# Patient Record
Sex: Male | Born: 1955
Health system: Southern US, Community
[De-identification: ages and names within clinical notes are randomized; demographics above are authoritative.]

## PROBLEM LIST (undated history)

## (undated) DIAGNOSIS — I10 Essential (primary) hypertension: Principal | ICD-10-CM

## (undated) DIAGNOSIS — M1990 Unspecified osteoarthritis, unspecified site: Secondary | ICD-10-CM

## (undated) DIAGNOSIS — L409 Psoriasis, unspecified: Secondary | ICD-10-CM

## (undated) DIAGNOSIS — F419 Anxiety disorder, unspecified: Secondary | ICD-10-CM

## (undated) DIAGNOSIS — E785 Hyperlipidemia, unspecified: Secondary | ICD-10-CM

## (undated) HISTORY — DX: Anxiety disorder, unspecified: F41.9

## (undated) HISTORY — DX: Essential (primary) hypertension: I10

## (undated) HISTORY — DX: Hyperlipidemia, unspecified: E78.5

## (undated) HISTORY — DX: Psoriasis, unspecified: L40.9

## (undated) HISTORY — PX: SHOULDER SURGERY: SHX246

## (undated) HISTORY — PX: APPENDECTOMY: SHX54

## (undated) HISTORY — DX: Unspecified osteoarthritis, unspecified site: M19.90

## (undated) HISTORY — PX: OTHER SURGICAL HISTORY: SHX169

## (undated) HISTORY — PX: POLYPECTOMY: SHX149

## (undated) HISTORY — PX: LEG SURGERY: SHX1003

---

## 2003-01-06 ENCOUNTER — Ambulatory Visit (HOSPITAL_BASED_OUTPATIENT_CLINIC_OR_DEPARTMENT_OTHER): Admission: RE | Admit: 2003-01-06 | Discharge: 2003-01-07 | Payer: Self-pay | Admitting: Orthopedic Surgery

## 2003-11-29 ENCOUNTER — Encounter (INDEPENDENT_AMBULATORY_CARE_PROVIDER_SITE_OTHER): Payer: Self-pay | Admitting: Specialist

## 2003-11-29 ENCOUNTER — Inpatient Hospital Stay (HOSPITAL_COMMUNITY): Admission: EM | Admit: 2003-11-29 | Discharge: 2003-11-30 | Payer: Self-pay | Admitting: Emergency Medicine

## 2005-08-20 ENCOUNTER — Ambulatory Visit (HOSPITAL_COMMUNITY): Admission: RE | Admit: 2005-08-20 | Discharge: 2005-08-20 | Payer: Self-pay | Admitting: Orthopedic Surgery

## 2005-08-20 ENCOUNTER — Ambulatory Visit (HOSPITAL_BASED_OUTPATIENT_CLINIC_OR_DEPARTMENT_OTHER): Admission: RE | Admit: 2005-08-20 | Discharge: 2005-08-20 | Payer: Self-pay | Admitting: Orthopedic Surgery

## 2007-01-17 ENCOUNTER — Ambulatory Visit: Payer: Self-pay | Admitting: Gastroenterology

## 2007-01-31 ENCOUNTER — Encounter: Payer: Self-pay | Admitting: Gastroenterology

## 2007-01-31 ENCOUNTER — Ambulatory Visit: Payer: Self-pay | Admitting: Gastroenterology

## 2008-01-14 LAB — PSA: .: 0.5

## 2009-03-15 LAB — HEMOGLOBIN A1C: Hgb A1c MFr Bld: 6 % (ref 4.0–6.0)

## 2009-06-04 ENCOUNTER — Ambulatory Visit: Payer: Self-pay | Admitting: Family Medicine

## 2009-11-29 ENCOUNTER — Ambulatory Visit: Payer: Self-pay | Admitting: Family Medicine

## 2009-11-29 DIAGNOSIS — E785 Hyperlipidemia, unspecified: Secondary | ICD-10-CM | POA: Insufficient documentation

## 2009-11-29 DIAGNOSIS — I10 Essential (primary) hypertension: Secondary | ICD-10-CM

## 2009-11-29 DIAGNOSIS — E1159 Type 2 diabetes mellitus with other circulatory complications: Secondary | ICD-10-CM | POA: Insufficient documentation

## 2009-11-29 HISTORY — DX: Hyperlipidemia, unspecified: E78.5

## 2009-11-29 HISTORY — DX: Essential (primary) hypertension: I10

## 2010-02-08 ENCOUNTER — Encounter (INDEPENDENT_AMBULATORY_CARE_PROVIDER_SITE_OTHER): Payer: Self-pay | Admitting: *Deleted

## 2010-06-28 ENCOUNTER — Encounter (INDEPENDENT_AMBULATORY_CARE_PROVIDER_SITE_OTHER): Payer: Self-pay | Admitting: *Deleted

## 2010-07-31 ENCOUNTER — Encounter (INDEPENDENT_AMBULATORY_CARE_PROVIDER_SITE_OTHER): Payer: Self-pay | Admitting: *Deleted

## 2010-08-02 ENCOUNTER — Ambulatory Visit: Payer: Self-pay | Admitting: Gastroenterology

## 2010-08-16 ENCOUNTER — Ambulatory Visit: Payer: Self-pay | Admitting: Gastroenterology

## 2010-08-16 HISTORY — PX: COLONOSCOPY: SHX174

## 2010-08-18 ENCOUNTER — Encounter: Payer: Self-pay | Admitting: Gastroenterology

## 2011-01-02 NOTE — Letter (Signed)
Summary: Results Letter  [facility]  [address] [place], [place] [address]   Phone: [phone]  Fax: [phone]        August 18, 2010 MRN: [id number]    Nathaniel Montes [address] [place], [place]  [address]    Dear [name],   Good news.  The polyps that were removed during your recent procedure were NOT pre-cancerous.  However since you have had pre-cancerous polyps removed in the past, I recommend that you have a repeat colonoscopy in 5 years.  We will therefore put your information in our reminder system and will contact you in 5 years to schedule a repeat procedure.  Please call if you have any questions or concerns.      Sincerely,  Rachael Fee MD  This letter has been electronically signed by your physician.  Appended Document: Results Letter letter mailed

## 2011-01-02 NOTE — Miscellaneous (Signed)
Summary: [facility] PV  Clinical Lists Changes  Medications: Added new medication of MOVIPREP 100 GM  SOLR (PEG-KCL-NACL-NASULF-NA ASC-C) As per prep instructions. - Signed Rx of MOVIPREP 100 GM  SOLR (PEG-KCL-NACL-NASULF-NA ASC-C) As per prep instructions.;  #1 x 0;  Signed;  Entered by: Ezra Sites RN;  Authorized by: Rachael Fee MD;  Method used: Electronically to [facility] [phone]*, [address] [place], [place]  [address], Ph: [phone], Fax: [phone] Observations: Added new observation of ALLERGY REV: Done (08/02/2010 7:58)    Prescriptions: MOVIPREP 100 GM  SOLR (PEG-KCL-NACL-NASULF-NA ASC-C) As per prep instructions.  #1 x 0   Entered by:   Ezra Sites RN   Authorized by:   Rachael Fee MD   Signed by:   Ezra Sites RN on 08/02/2010   Method used:   Electronically to        [facility] [phone]* (retail)       [address] [place], [place]  [address]       Ph: [phone]       Fax: [phone]   RxID:   [id number]

## 2011-01-02 NOTE — Letter (Signed)
Summary: [facility] Instructions  [facility]  [address] [place], [place] [address]   Phone: [phone]  Fax: [phone]       Nathaniel Montes    09/30/1969    MRN: [id number]        Procedure Day Dorna Bloom: Wed. 08/16/2010     Arrival Time: 10:00AM     Procedure Time: 11:00AM     Location of Procedure:                    Juliann Pares  [facility] (4th Floor)                        PREPARATION FOR COLONOSCOPY WITH MOVIPREP   Starting 5 days prior to your procedure 09/09 do not eat nuts, seeds, popcorn, corn, beans, peas,  salads, or any raw vegetables.  Do not take any fiber supplements (e.g. Metamucil, Citrucel, and Benefiber).  THE DAY BEFORE YOUR PROCEDURE         DATE: 09/13  DAY: [place] Mote.  1.  Drink clear liquids the entire day-NO SOLID FOOD  2.  Do not drink anything colored red or purple.  Avoid juices with pulp.  No orange juice.  3.  Drink at least 64 oz. (8 glasses) of fluid/clear liquids during the day to prevent dehydration and help the prep work efficiently.  CLEAR LIQUIDS INCLUDE: Water Jello Ice Popsicles Tea (sugar ok, no milk/cream) Powdered fruit flavored drinks Coffee (sugar ok, no milk/cream) Gatorade Juice: apple, white grape, white cranberry  Lemonade Clear bullion, consomm, broth Carbonated beverages (any kind) Strained chicken noodle soup Hard Candy                             4.  In the morning, mix first dose of MoviPrep solution:    Empty 1 Pouch A and 1 Pouch B into the disposable container    Add lukewarm drinking water to the top line of the container. Mix to dissolve    Refrigerate (mixed solution should be used within 24 hrs)  5.  Begin drinking the prep at 5:00 p.m. The MoviPrep container is divided by 4 marks.   Every 15 minutes drink the solution down to the next mark (approximately 8 oz) until the full liter is complete.   6.  Follow completed prep with 16 oz of clear liquid of your choice (Nothing red or  purple).  Continue to drink clear liquids until bedtime.  7.  Before going to bed, mix second dose of MoviPrep solution:    Empty 1 Pouch A and 1 Pouch B into the disposable container    Add lukewarm drinking water to the top line of the container. Mix to dissolve    Refrigerate  THE DAY OF YOUR PROCEDURE      DATE: 09/14 DAY: Wed.  Beginning at 6:00AM (5 hours before procedure):         1. Every 15 minutes, drink the solution down to the next mark (approx 8 oz) until the full liter is complete.  2. Follow completed prep with 16 oz. of clear liquid of your choice.    3. You may drink clear liquids until 9:00AM (2 HOURS BEFORE PROCEDURE).   MEDICATION INSTRUCTIONS  Unless otherwise instructed, you should take regular prescription medications with a small sip of water   as early as possible the morning of your procedure.  OTHER INSTRUCTIONS  You will need a responsible adult at least 57 years of age to accompany you and drive you home.   This person must remain in the waiting room during your procedure.  Wear loose fitting clothing that is easily removed.  Leave jewelry and other valuables at home.  However, you may wish to bring a book to read or  an iPod/MP3 player to listen to music as you wait for your procedure to start.  Remove all body piercing jewelry and leave at home.  Total time from sign-in until discharge is approximately 2-3 hours.  You should go home directly after your procedure and rest.  You can resume normal activities the  day after your procedure.  The day of your procedure you should not:   Drive   Make legal decisions   Operate machinery   Drink alcohol   Return to work  You will receive specific instructions about eating, activities and medications before you leave.    The above instructions have been reviewed and explained to me by   Ezra Sites RN  August 02, 2010 8:17 AM     I fully understand and can verbalize  these instructions _____________________________ Date _________

## 2011-01-02 NOTE — Procedures (Signed)
Summary: Colonoscopy  Patient: Nathaniel Montes Note: All result statuses are Final unless otherwise noted.  Tests: (1) Colonoscopy (COL)   COL Colonoscopy           DONE     [facility]     [address]     [place], [place]  [address]           COLONOSCOPY PROCEDURE REPORT           PATIENT:  Nathaniel Montes, Nathaniel Montes  MR#:  [id number]     BIRTHDATE:  July 15, 1956, 53 yrs. old  GENDER:  male     ENDOSCOPIST:  Rachael Fee, MD     PROCEDURE DATE:  08/16/2010     PROCEDURE:  Colonoscopy with snare polypectomy     ASA CLASS:  Class II     INDICATIONS:  6 polyps removed in 2008, 4 were adenomatous     MEDICATIONS:   Fentanyl 75 mcg IV, Versed 8 mg IV           DESCRIPTION OF PROCEDURE:   After the risks benefits and     alternatives of the procedure were thoroughly explained, informed     consent was obtained.  Digital rectal exam was performed and     revealed no rectal masses.   The LB CF-H180AL [id number] endoscope     was introduced through the anus and advanced to the cecum, which     was identified by both the appendix and ileocecal valve, without     limitations.  The quality of the prep was good, using MoviPrep.     The instrument was then slowly withdrawn as the colon was fully     examined.     <<PROCEDUREIMAGES>>     FINDINGS:  There were five small, sessile polyps found. All were     removed with cold snare. Four were retrieved and sent to pathology     (jar 1). These ranged in size from 2mm to 4mm, located in     descending and sigmoid segments (see image4).  This was otherwise     a normal examination of the colon (see image5, image3, and     image1).   Retroflexed views in the rectum revealed no     abnormalities.    The scope was then withdrawn from the patient     and the procedure completed.     COMPLICATIONS:  None           ENDOSCOPIC IMPRESSION:     1) Five small polyps, all removed.  Four were retrieved and sent     to pathology     2) Otherwise normal  examination           RECOMMENDATIONS:     1) Given your personal history of adenomatous (pre-cancerous)     polyps, you will need a repeat colonoscopy in 5 years even if the     polyps removed today are NOT precancerous.     2) You will receive a letter within 1-2 weeks with the results     of your biopsy as well as final recommendations. Please call my     office if you have not received a letter after 3 weeks.  Likely to     need repeat colonoscopy in 3-5 years.           ______________________________     Rachael Fee, MD           n.  eSIGNED:   Rachael Fee at 08/16/2010 11:32 AM           Lynford Humphrey, [id number]  Note: An exclamation mark (!) indicates a result that was not dispersed into the flowsheet. Document Creation Date: 08/16/2010 11:33 AM _______________________________________________________________________  (1) Order result status: Final Collection or observation date-time: 08/16/2010 11:26 Requested date-time:  Receipt date-time:  Reported date-time:  Referring Physician:   Ordering Physician: Rob Bunting [phone]) Specimen Source:  Source: Launa Grill Order Number: [id number] Lab site:   Appended Document: Colonoscopy     Procedures Next Due Date:    Colonoscopy: 08/2015

## 2011-01-02 NOTE — Letter (Signed)
Summary: Previsit letter  [facility]  [address] [place], [place] [address]   Phone: [phone]  Fax: [phone]       06/28/2010 MRN: [id number]  Nathaniel Montes [address] [place], [place]  [address]  Dear [name],  Welcome to the Gastroenterology Division at [facility].    You are scheduled to see a nurse for your pre-procedure visit on 08-02-10 at 8:00a.m. on the 3rd floor at [facility], [address]  We ask that you try to arrive at our office 15 minutes prior to your appointment time to allow for check-in.  Your nurse visit will consist of discussing your medical and surgical history, your immediate family medical history, and your medications.    Please bring a complete list of all your medications or, if you prefer, bring the medication bottles and we will list them.  We will need to be aware of both prescribed and over the counter drugs.  We will need to know exact dosage information as well.  If you are on blood thinners (Coumadin, Plavix, Aggrenox, Ticlid, etc.) please call our office today/prior to your appointment, as we need to consult with your physician about holding your medication.   Please be prepared to read and sign documents such as consent forms, a financial agreement, and acknowledgement forms.  If necessary, and with your consent, a friend or relative is welcome to sit-in on the nurse visit with you.  Please bring your insurance card so that we may make a copy of it.  If your insurance requires a referral to see a specialist, please bring your referral form from your primary care physician.  No co-pay is required for this nurse visit.     If you cannot keep your appointment, please call [phone] to cancel or reschedule prior to your appointment date.  This allows [place] the opportunity to schedule an appointment for another patient in need of care.    Thank you for choosing [facility] for your medical  needs.  We appreciate the opportunity to care for you.  Please visit [place] at our website  to learn more about our practice.                     Sincerely.                                                                                                                   The Gastroenterology Division

## 2011-01-02 NOTE — Letter (Signed)
Summary: Colonoscopy Letter  [facility]  [address] [place], [place] [address]   Phone: [phone]  Fax: [phone]      February 08, 2010 MRN: [id number]   JEREMEY BASCOM [address] [place], [place]  [address]   Dear Mr. Laser,   According to your medical record, it is time for you to schedule a Colonoscopy. The American Cancer Society recommends this procedure as a method to detect early colon cancer. Patients with a family history of colon cancer, or a personal history of colon polyps or inflammatory bowel disease are at increased risk.  This letter has been generated based on the recommendations made at the time of your procedure. If you feel that in your particular situation this may no longer apply, please contact our office.  Please call our office at [phone] to schedule this appointment or to update your records at your earliest convenience.  Thank you for cooperating with [place] to provide you with the very best care possible.   Sincerely,  Rachael Fee, M.D.  [facility] Gastroenterology Division [phone]

## 2011-04-20 NOTE — Op Note (Signed)
NAME:  Nathaniel Montes, Nathaniel Montes                         ACCOUNT NO.:  [id number]   MEDICAL RECORD NO.:  [id number]                   PATIENT TYPE:  AMB   LOCATION:  DSC                                  FACILITY:  [facility]   PHYSICIAN:  Harvie Junior, M.D.                DATE OF BIRTH:  [birth year]   DATE OF PROCEDURE:  01/06/2003  DATE OF DISCHARGE:  01/07/2003                                 OPERATIVE REPORT   PREOPERATIVE DIAGNOSIS:  Displaced left fibula fracture with widening of the  median mortis.   POSTOPERATIVE DIAGNOSIS:  Displaced left fibula fracture with widening of  the median mortis.   OPERATION PERFORMED:  Open reduction internal fixation of left distal fibula  fracture.   SURGEON:  Harvie Junior, M.D.   ASSISTANT:  Marshia Ly, P.A.   ANESTHESIA:  General.   INDICATIONS FOR PROCEDURE:  The patient is a 55 year old male with along  history of having a fall and twist of his ankle.  He ultimately was  evaluated and noted to have a displaced ankle fracture.  After discussions  of treatment options, ultimately was taken to the operating room for open  reduction internal fixation.   DESCRIPTION OF PROCEDURE:  The patient was taken to the operating room where  after adequate anesthesia was obtained with a general anesthetic, the  patient was placed supine on the operating table.  The left leg was then  prepped and draped in the usual sterile fashion.  Following Esmarch  exsanguination of the extremity, a blood pressure tourniquet was inflated to  300 mmHg.  Following this, a linear incision was made over the lateral  aspect of the ankle.  Subcutaneous tissue dissected down to the level of the  fibula.  The fibula was identified, clotting  elements were removed.  The  fracture was anatomically reduced and held with an interfragmentary screw.  A five hole one third tubular plate was then attached to the lateral side of  the ankle in standard AO fashion.  Fluoroscopic imaging  was used throughout  the case to ensure that the screw lengths were appropriate as well as the  mortise closed down with reduction of the lateral side of the ankle.  At  this point the wound was copiously irrigated and suctioned dry.  The  subcutaneous layer was closed with 3-0Vicryl and the skin with skin staples.  Sterile compressive dressing was applied as well as a U posterior splint and  the patient taken to the recovery room where he was noted to be in  satisfactory condition.  The estimated blood loss for this procedure was  none.  Harvie Junior, M.D.    Ranae Plumber  D:  03/25/2003  T:  03/25/2003  Job:  [id number]

## 2011-04-20 NOTE — H&P (Signed)
NAME:  Nathaniel Montes, Nathaniel Montes                         ACCOUNT NO.:  [id number]   MEDICAL RECORD NO.:  [id number]                   PATIENT TYPE:  EMS   LOCATION:  ED                                   [facility]ACILITY:  [facility]   PHYSICIAN:  Ollen Gross. Vernell Morgans, M.D.              DATE OF BIRTH:  August 02, 1956   DATE OF ADMISSION:  11/29/2003  DATE OF DISCHARGE:                                HISTORY & PHYSICAL   Nathaniel Montes is a 55 year old white male who woke up this morning with some  right lower quadrant pain.  His pain has worsened throughout the day. He  denies any nausea, vomiting, fevers, chills, chest pain, shortness of  breath, diarrhea, or dysuria. He came to the emergency department because  the pain was continuing to get worse. He had a CBC done which was  significant for a white count of 11,100 and a UA which was negative. He has  otherwise been in pretty good health.  The rest of his review of systems is  unremarkable.   PAST MEDICAL HISTORY:  Significant for hypercholesterolemia.   PAST SURGICAL HISTORY:  Significant for left leg ORIF, left knee surgery.   MEDICATIONS:  Paxil and Zocor.   ALLERGIES:  No known drug allergies.   SOCIAL HISTORY:  He denies any use of tobacco products. He occasionally  drinks some alcohol.   FAMILY HISTORY:  Noncontributory.   PHYSICAL EXAMINATION:  VITAL SIGNS: His temperature is 100.6, blood pressure  166/94, pulse 106.  GENERAL: He is a well-developed, well-nourished, white male in no acute  distress.  SKIN: Warm and dry with no jaundice.  HEENT:  Eyes - extraocular muscles are intact. Pupils equal, round, and  reactive to light. Sclerae nonicteric.  LUNGS: Clear bilaterally with no use of accessory respiratory muscles.  HEART: Regular rate and rhythm with an impulse on the left chest.  ABDOMEN: Soft with some focal guarding and tenderness in the right lower  quadrant, but no evidence of peritonitis. I cannot palpate any masses or   hepatosplenomegaly.  EXTREMITIES: No clubbing, cyanosis, or edema.  PSYCHOLOGIC: She is awake, alert, and oriented x3, with no anxiety or  depression.   His lab work was again significant for a UA that was negative and a white  count of 11,100.   ASSESSMENT/PLAN:  This is a 55 year old gentleman with what appears to be  acute appendicitis on physical exam. I recommend he consider going to the  operating room for laparoscopic appendectomy. I have explained to him and  his family the risks and benefits of the operation as well as some of the  technical aspects. He understands and wishes to proceed.  Ollen Gross. Vernell Morgans, M.D.   PST/MEDQ  D:  11/29/2003  T:  11/29/2003  Job:  [id number]

## 2011-04-20 NOTE — Op Note (Signed)
NAME:  Nathaniel Montes, Nathaniel Montes                         ACCOUNT NO.:  [id number]   MEDICAL RECORD NO.:  [id number]                   PATIENT TYPE:  AMB   LOCATION:  DSC                                  FACILITY:  [facility]   PHYSICIAN:  Harvie Junior, M.D.                DATE OF BIRTH:  [birth year]   DATE OF PROCEDURE:  01/06/2003  DATE OF DISCHARGE:                                 OPERATIVE REPORT   PREOPERATIVE DIAGNOSIS:  Fracture of lateral ankle, left side with mortise  displacement.   POSTOPERATIVE DIAGNOSIS:  Fracture of lateral ankle, left side with mortise  displacement.   OPERATION PERFORMED:  Open reduction internal fixation of left lateral  malleolus fracture with open removal of the deltoid ligament from the medial  aspect of the ankle with irrigation and debridement of the ankle joint.   SURGEON:  Harvie Junior, M.D.   ASSISTANT:  Marshia Ly, P.A.   ANESTHESIA:  General.   INDICATIONS FOR PROCEDURE:  The patient is a 55 year old male with a long  history of having a twisting type injury to his ankle.  He was evaluated and  noted to have a fracture of the ankle with mortise widening.  Because of  displacement of the ankle mortise, and obvious pain and swelling, patient  was evaluated and noted to have this fracture.  We talked about treatment  options and felt that he would be best served with open reduction internal  fixation of his ankle and discussed this with him.  He was set up for this  surgery and brought to the operating room for this procedure.   DESCRIPTION OF PROCEDURE:  The patient was brought to the operating room and  after an adequate level of anesthesia was obtained with general anesthetic,  the patient was placed on the operating table.  The left leg was prepped and  draped in the usual sterile fashion.  Following this, an incision was made  over the lateral aspect of the ankle.  The subcutaneous tissue were  dissected down to the level of the fibula.   Multiple attempts were made at  cleansing fracture elements and trying to reduce the fibula.  It just would  not reduce anatomically.  At this point attention was turned towards the  medial aspect of the ankle where a small incision was made.  A medial  arthrotomy was undertaken.  The ankle was irrigated.  A Freer elevator was  used to sweep the deltoid ligament out of the medial aspect of the joint.  Once this was accomplished, the lateral ankle fracture would now reduce  anatomically.  At this point interfragmentary fixation was achieved with a  20 mm cortical interference screw.  Following this, attention was turned  towards a five-hole plate.  The five-hole plate was used to fix the lateral  side of the ankle.  Excellent fixation was achieved with standard AO  technique in a five-hole plate.  Cancellous screws distally, cortical screws  proximally.  At this point x-ray imaging was used.  The ankle mortise had  reduced nicely and the lateral x-ray showed all the screws to be properly  positioned.  Fluoroscopy was used throughout this case.  At this point the  wound was copiously irrigated and suctioned dry.  The medial wound was  closed with 4-0 nylon interrupted sutures.  The lateral wound was closed  with a combination of 2-0 Vicryl and 4-0 nylon suture.  At this point a  sterile compressive dressing was applied on the ankle and the patient was  taken to the recovery room where she was noted  to be in satisfactory condition.  The patient was placed in a plaster  splint.  The estimated blood loss for this procedure was none.                                                Harvie Junior, M.D.    Ranae Plumber  D:  01/06/2003  T:  01/06/2003  Job:  [id number]

## 2011-04-20 NOTE — Op Note (Signed)
NAMEBENJIMAN, SEDGWICK               ACCOUNT NO.:  [id number]   MEDICAL RECORD NO.:  [id number]          PATIENT TYPE:  AMB   LOCATION:  DSC                          FACILITY:  [facility]   PHYSICIAN:  Feliberto Gottron. Turner Daniels, M.D.   DATE OF BIRTH:  December 12, 1955   DATE OF PROCEDURE:  08/20/2005  DATE OF DISCHARGE:                                 OPERATIVE REPORT   PREOPERATIVE DIAGNOSIS:  Right shoulder impingement syndrome, partial-  thickness rotator cuff tear, labral tear and biceps tear.   POSTOPERATIVE DIAGNOSIS:  Right shoulder impingement syndrome, partial-  thickness rotator cuff tear, labral tear and biceps tear.   PROCEDURE:  Right shoulder arthroscopic anteroinferior acromioplasty,  debridement of degenerative complex labral tear, debridement of biceps  tendon stump -- the distal aspect had actually retracted into the arm -- and  debridement of internal leaflet rotator cuff tear, partial-thickness.   SURGEON:  Feliberto Gottron. Turner Daniels, M.D.   FIRST ASSISTANT:  Skip Mayer, P.A.-C.   ANESTHETIC:  Interscalene block with general anesthesia.   ESTIMATED BLOOD LOSS:  Minimal.   FLUID REPLACEMENT:  800 mL of crystalloid.   DRAINS PLACED:  None.   TOURNIQUET TIME:  None.   INDICATIONS FOR PROCEDURE:  [age]-year-old man injured on the job with  persistent right shoulder pain and catching sensation.  MRI scan showed  impingement, rotator cuff irritation, partial-thickness tear, labral tear  and possible biceps tear.  Having failed conservative treatment, he is taken  for arthroscopic evaluation and treatment of his right shoulder.  Risks and  benefits of surgery were discussed with him in advance and all questions  answered.   DESCRIPTION OF PROCEDURE:  The patient was identified by armband and taken  the operating room at [facility].  Appropriate site monitors  were attached and interscalene block on the right anesthesia induced.  This  was followed a general endotracheal  anesthesia and he was then placed in the  beach-chair position and the right upper extremity prepped and draped in the  usual sterile fashion from the wrist to the hemithorax.  Using a #11 blade,  standard portals were then made 1.5 cm anterior to the [facility] joint, lateral to  the junction of the middle and posterior thirds of the acromion and  posterior to the posterolateral corner of the acromion process.  The inflow  was placed anteriorly, the arthroscope laterally and a 4.2 great white  sucker shaver posteriorly.  Diagnostic arthroscopy revealed a fairly large  type 2 subacromial spur, which was outlined with the great white sucker  shaver and then removed with a 4.5 hooded Vortex bur.  This allowed further  evaluation of the rotator cuff, which was grossly intact externally.  Small  bleeders were identified and cauterized with the underwater electrocautery  and the arthroscope was then inserted into the glenohumeral joint, where we  immediately identified some fairly large complex degenerative flap tears of  the labrum.  Superiorly, there was a stump of the biceps tendon; it was  subluxing in and out of the glenohumeral joint and these degenerative torn  tissues were then  removed.  The distal limb of the biceps was retracted into  the arm and it was no where to be found.  There was an internal leaflet tear  of the rotator cuff, partial thickness, maybe 5% of the supraspinatus; this  was debrided back to stable margin.  The rest the rotator cuff and articular  cartilages were in good condition.  The labrum was then probed; there was no  tearing of labrum off the rim of the glenoid; most of it seemed to be  peripheral and degenerative in nature, except for the biceps stump, which  looked like it had been traumatically torn.  Satisfied with the debridement,  the shoulder was then irrigated out with normal saline solution, the  arthroscopic instruments removed and a dressing of Xeroform, 4 x 4  dressing  sponges, Hypafix tape and a sling applied.  The patient was then laid  supine, awakened and taken to the recovery room without difficulty.      Feliberto Gottron. Turner Daniels, M.D.  Electronically Signed     FJR/MEDQ  D:  08/20/2005  T:  08/21/2005  Job:  [id number]

## 2011-04-20 NOTE — Op Note (Signed)
NAME:  Nathaniel Montes, Nathaniel Montes                         ACCOUNT NO.:  [id number]   MEDICAL RECORD NO.:  [id number]                   PATIENT TYPE:  INP   LOCATION:  0483                                 FACILITY:  [facility]   PHYSICIAN:  Ollen Gross. Vernell Morgans, M.D.              DATE OF BIRTH:  11/05/1956   DATE OF PROCEDURE:  12/08/2003  DATE OF DISCHARGE:  11/30/2003                                 OPERATIVE REPORT   PREOPERATIVE DIAGNOSIS:  Appendicitis.   POSTOPERATIVE DIAGNOSIS:  Appendicitis.   PROCEDURE:  Laparoscopic appendectomy.   SURGEON:  Ollen Gross. Carolynne Edouard, M.D.   ANESTHESIA:  General endotracheal.   DESCRIPTION OF PROCEDURE:  After informed consent was obtained, the patient  was brought to the operating room and placed in a supine position on the  operating room, where after adequate induction of general anesthesia the  patient's abdomen was prepped with Betadine and draped in the usual sterile  manner.  The area below the umbilicus was infiltrated with 0.25% Marcaine  and a small incision was made with a 15 blade knife.  This incision was  carried down through the subcutaneous tissue bluntly with a Kelly clamp and  Army-Navy retractors until the linea alba was identified.  The linea alba  was incised with a 15 blade knife and each side was grasped with Kocher  clamps and elevated anteriorly.  The preperitoneal space was then probed  bluntly with a hemostat until the peritoneum was opened and access was  gained to the abdominal cavity.  A 0 Vicryl pursestring stitch is placed in  the fascia surrounding the opening.  A Hasson cannula was then placed  through the opening and anchored in place with the previously-placed Vicryl  pursestring stitch.  The abdomen was then insufflated with carbon dioxide  without difficulty.  The patient was then placed in some Trendelenburg  position and rotated so his right side was slightly up.  A site just above  the umbilicus was chosen for placement of a 5  mm port.  This area was  infiltrated with 0.25% Marcaine and a small stab incision was made with a 15  blade knife and a 5 mm port was placed bluntly through this incision into  the abdominal cavity under direct vision.  In the suprapubic area another  site was chosen for placement of a 12 mm port.  This area was infiltrated  with 0.25% Marcaine, a small incision was made with a 15 blade knife, and a  12 mm port was placed bluntly through this incision into the abdominal  cavity under direct vision.  A blunt grasper was placed through the 12 mm  port and the right lower quadrant was inspected and an enlarged, inflamed  appendix was readily identified.  The appendix was able to be held up in the  air with the blunt grasper.  The mesoappendix was then taken down with the  Harmonic scalpel until the base of the appendix at its junction with the  cecum was clear.  A blunt grasper was then placed through the 5 mm port to  hold the appendix up in the air.  An endoscopic GIA stapler with a blue load  was then placed through the 12 mm port and across the base of the appendix  and clamped.  It was allowed to sit for a minute and then fired, thereby  dividing the base of the appendix at its junction with the cecum between  staple lines.  An endoscopic bag was then placed through the 12 mm port and  the appendix was placed within the bag, and the bag was sealed.  The right  lower quadrant was then inspected, and the staple line was intact and the  area was completely hemostatic.  The abdomen was then irrigated with copious  amounts of saline.  The laparoscope was then moved to the 12 mm port and a  grasper was placed through the Hasson and used to grasp the opening in the  bag.  The bag with the appendix was then removed through the infraumbilical  incision without difficulty.  The fascial defect was then closed with the  previously-placed Vicryl pursestring stitch as well as with another  interrupted  0 Vicryl stitch.  The rest of the ports were removed under  direct vision and were found to be hemostatic.  The gas was allowed to  escape.  The fascia of the suprapubic port was closed with an interrupted 0  Vicryl stitch.  Each of the incisions was irrigated with copious amounts of  saline.  The skin incisions were then all closed with interrupted 4-0  Monocryl subcuticular stitches, Benzoin and Steri-Strips were applied.  The  patient tolerated the procedure well.  At the end of the case all needle,  sponge, and instrument counts were correct.  The patient was then awakened  and taken to the recovery room in stable condition.                                               Ollen Gross. Vernell Morgans, M.D.    PST/MEDQ  D:  12/08/2003  T:  12/08/2003  Job:  [id number]

## 2011-06-19 LAB — HEPATIC FUNCTION PANEL: AST: 27 U/L (ref 14–40)

## 2011-06-19 LAB — BASIC METABOLIC PANEL
Creatinine: 1.1 mg/dL (ref 0.6–1.3)
Glucose: 87 mg/dL

## 2011-06-19 LAB — CBC AND DIFFERENTIAL: WBC: 7.9 10^3/mL

## 2011-06-19 LAB — LIPID PANEL: Cholesterol: 193 mg/dL (ref 0–200)

## 2012-05-10 ENCOUNTER — Emergency Department (INDEPENDENT_AMBULATORY_CARE_PROVIDER_SITE_OTHER)
Admission: EM | Admit: 2012-05-10 | Discharge: 2012-05-10 | Disposition: A | Discharge status: Home / Self Care | Payer: Commercial Indemnity | Source: Home / Self Care

## 2012-05-10 DIAGNOSIS — R21 Rash and other nonspecific skin eruption: Secondary | ICD-10-CM

## 2012-05-10 DIAGNOSIS — B029 Zoster without complications: Secondary | ICD-10-CM

## 2012-05-10 MED ORDER — GABAPENTIN 100 MG PO CAPS: 100.0000 mg | ORAL_CAPSULE | Freq: Three times a day (TID) | ORAL | Status: DC

## 2012-05-10 MED ORDER — VALACYCLOVIR HCL 1 G PO TABS: 1000.0000 mg | ORAL_TABLET | Freq: Three times a day (TID) | ORAL | Status: DC

## 2012-05-10 NOTE — ED Provider Notes (Signed)
History     CSN: [id number]  Arrival date & time 05/10/12  1225   First MD Initiated Contact with Patient 05/10/12 1246      Chief Complaint  Patient presents with  . Herpes Zoster   HPI Comments: HPI  This patient complains of a RASH  Location: R upper back, R arm, R upper chest  Onset: 3 days   Course: progressive rash over affected areas with blistering and redness, mild burning  Self-treated with: nothing   Improvement with treatment: n/a  History  Itching: no  Tenderness: mild  New medications/antibiotics: no  Pet exposure: no  Recent travel or tropical exposure: no  New soaps, shampoos, detergent, clothing: no  Tick/insect exposure: no  Red Flags  Feeling ill: no  Fever: no  Facial/tongue swelling/difficulty breathing: no  Diabetic or immunocompromised: on enbrel for psoriasis      Patient is a 56 y.o. male presenting with rash.  Rash  This is a new problem. Episode onset: 3 days ago  The problem is associated with nothing. There has been no fever. Affected Location: R upper back, R arm, R upper chest  The pain is at a severity of 0/10. The patient is experiencing no pain. Associated symptoms include blisters, itching and weeping. Pertinent negatives include no pain. He has tried nothing for the symptoms. Risk factors: on enbrel for psoriasis.    No past medical history on file.  No past surgical history on file.  No family history on file.  History  Substance Use Topics  . Smoking status: Not on file  . Smokeless tobacco: Not on file  . Alcohol Use: Not on file      Review of Systems  Skin: Positive for itching and rash.  All other systems reviewed and are negative.    Allergies  Review of patient's allergies indicates no known allergies.  Home Medications   Current Outpatient Rx  Name Route Sig Dispense Refill  . ETANERCEPT 50 MG/ML Offerman SOLN Subcutaneous Inject 50 mg into the skin once a week.    Marland Kitchen PAROXETINE HCL 10 MG PO TABS Oral Take 10 mg  by mouth every morning.    Marland Kitchen GABAPENTIN 100 MG PO CAPS Oral Take 1 capsule (100 mg total) by mouth 3 (three) times daily. 60 capsule 0  . VALACYCLOVIR HCL 1 G PO TABS Oral Take 1 tablet (1,000 mg total) by mouth 3 (three) times daily. 21 tablet 0    BP 148/84  Pulse 88  Temp(Src) 98.4 F (36.9 C) (Oral)  Resp 14  Ht 5\' 10"  (1.778 m)  Wt 257 lb 4 oz (116.688 kg)  BMI 36.91 kg/m2  SpO2 96%  Physical Exam  Constitutional: He appears well-developed and well-nourished.  HENT:  Head: Normocephalic and atraumatic.  Eyes: Conjunctivae are normal. Pupils are equal, round, and reactive to light.  Neck: Normal range of motion. Neck supple.  Cardiovascular: Normal rate and regular rhythm.   Pulmonary/Chest: Effort normal and breath sounds normal.  Skin:          + erythema and blistering over affected areas.  No purulent drainage.     ED Course  Procedures (including critical care time)  Labs Reviewed - No data to display No results found.   No diagnosis found.    MDM  Exam most consistent with zoster.  No signs of superinfection in setting of enbrel use.  Will rx valtrex  Neurontin for neuralgia Discussed infectious red flags at length.  Follow up  with PCP in 3-5 days.  Handout given.     The patient and/or caregiver has been counseled thoroughly with regard to treatment plan and/or medications prescribed including dosage, schedule, interactions, rationale for use, and possible side effects and they verbalize understanding. Diagnoses and expected course of recovery discussed and will return if not improved as expected or if the condition worsens. Patient and/or caregiver verbalized understanding.             Floydene Flock, MD 05/10/12 1326

## 2012-05-10 NOTE — ED Provider Notes (Signed)
Pt seen by Dr. Alvester Morin for Shingles.  Marlaine Hind, MD 05/10/12 [phone]

## 2012-05-10 NOTE — ED Notes (Signed)
Pt developed red bumpy slightly painful rash on R arm R chest and R upper back started 3 days ago

## 2012-05-10 NOTE — Discharge Instructions (Signed)

## 2012-11-17 ENCOUNTER — Ambulatory Visit (INDEPENDENT_AMBULATORY_CARE_PROVIDER_SITE_OTHER): Payer: Commercial Indemnity | Admitting: Family Medicine

## 2012-11-17 ENCOUNTER — Encounter: Payer: Self-pay | Admitting: Family Medicine

## 2012-11-17 VITALS — BP 149/85 | HR 77 | Ht 70.0 in | Wt 265.0 lb

## 2012-11-17 DIAGNOSIS — E785 Hyperlipidemia, unspecified: Secondary | ICD-10-CM

## 2012-11-17 DIAGNOSIS — F411 Generalized anxiety disorder: Secondary | ICD-10-CM

## 2012-11-17 DIAGNOSIS — L408 Other psoriasis: Secondary | ICD-10-CM

## 2012-11-17 DIAGNOSIS — L409 Psoriasis, unspecified: Secondary | ICD-10-CM

## 2012-11-17 DIAGNOSIS — F419 Anxiety disorder, unspecified: Secondary | ICD-10-CM

## 2012-11-17 DIAGNOSIS — I10 Essential (primary) hypertension: Secondary | ICD-10-CM

## 2012-11-17 HISTORY — DX: Psoriasis, unspecified: L40.9

## 2012-11-17 HISTORY — DX: Anxiety disorder, unspecified: F41.9

## 2012-11-17 LAB — BASIC METABOLIC PANEL WITH GFR
Calcium: 9.8 mg/dL (ref 8.4–10.5)
Creat: 1.02 mg/dL (ref 0.50–1.35)
GFR, Est African American: >89 mL/min
Glucose, Bld: 113 mg/dL — ABNORMAL HIGH (ref 70–99)
Sodium: 138 mEq/L (ref 135–145)

## 2012-11-17 LAB — LIPID PANEL
HDL: 46 mg/dL (ref >39)
Total CHOL/HDL Ratio: 3.8 Ratio
Triglycerides: 177 mg/dL — ABNORMAL HIGH (ref <150)

## 2012-11-17 MED ORDER — PAROXETINE HCL 20 MG PO TABS: 20.0000 mg | ORAL_TABLET | ORAL | Status: DC

## 2012-11-17 NOTE — Progress Notes (Signed)
CC: Nathaniel Montes is a 56 y.o. male is here for Establish Care   Subjective: HPI:  Patient presents to establish care  Carries a diagnosis of hypertension: Using lisinopril 10 mg daily without any recent missed doses. No outside blood pressures to report. Denies shortness of breath, chest pain, angioedema, irregular heartbeat, peripheral edema, orthopnea. Nor cough.  Carries a diagnosis of hyperlipidemia: Has been taking Zocor for years without right upper quadrant pain, myalgias, nor skin or scleral discoloration. Tries to watch the eats, tries to stick to a low calorie diet. No formal exercise program.  Carries a diagnosis anxiety: Has been taking Paxil for over 3-4 years and is very satisfied with the results. Maybe once a month he will have an anxiety attack, unknown triggers, he can control this within minutes with breathing exercises and talk himself out of the panic and anxiety. He denies sexual side effects from the medication. Denies depression, paranoia, mental disturbance  Carries a diagnosis of psoriasis: Was once on Humira but lost its effectiveness. He's very satisfied with enbrel.  He is a patch of psoriasis on his lower back but no other skin complaints. Denies any joint pain, eye pain, nor GI irregularities   Review Of Systems Outlined In HPI  Past Medical History  Diagnosis Date  . HYPERTENSION 11/29/2009  . HYPERLIPIDEMIA 11/29/2009  . Psoriasis 11/17/2012  . Anxiety 11/17/2012     No family history on file.   History  Substance Use Topics  . Smoking status: Former Smoker    Types: Cigarettes  . Smokeless tobacco: Not on file  . Alcohol Use: Yes     Objective: Filed Vitals:   11/17/12 0834  BP: 149/85  Pulse: 77    General: Alert and Oriented, No Acute Distress HEENT: Pupils equal, round, reactive to light. Conjunctivae clear.  External ears unremarkable, canals clear with intact TMs with appropriate landmarks.  Middle ear appears open without  effusion. Pink inferior turbinates.  Moist mucous membranes, pharynx without inflammation nor lesions.  Neck supple without palpable lymphadenopathy nor abnormal masses. Lungs: Clear to auscultation bilaterally, no wheezing/ronchi/rales.  Comfortable work of breathing. Good air movement. Cardiac: Regular rate and rhythm. Normal S1/S2.  No murmurs, rubs, nor gallops.   Abdomen: Obese soft nontender Extremities: No peripheral edema.  Strong peripheral pulses.  Mental Status: No depression, anxiety, nor agitation. Skin: Warm and dry. Psoriatic plaque on upper buttocks, mild hyperkeratosis on the native his neck  Assessment & Plan: Nathaniel Montes was seen today for establish care.  Diagnoses and associated orders for this visit:  Hypertension - BASIC METABOLIC PANEL WITH GFR  Hyperlipidemia - Lipid panel  Anxiety - PARoxetine (PAXIL) 20 MG tablet; Take 1 tablet (20 mg total) by mouth every morning.  Psoriasis  Hypertension: Uncontrolled, I've asked him to focus on low salt and increasing physical activity, if not improved in 4 week followup we will consider increasing lisinopril. Hyperlipidemia: He believes it's been over a year since his cholesterol was checked, fasting lipid panel today Anxiety: Stable, continue Paxil Psoriasis: Controlled and stable continue Embrel    Return in about 4 weeks (around 12/15/2012).

## 2012-11-18 ENCOUNTER — Encounter: Payer: Self-pay | Admitting: Family Medicine

## 2012-11-19 ENCOUNTER — Encounter: Payer: Self-pay | Admitting: Family Medicine

## 2012-12-18 ENCOUNTER — Ambulatory Visit (INDEPENDENT_AMBULATORY_CARE_PROVIDER_SITE_OTHER): Payer: Commercial Indemnity | Admitting: Family Medicine

## 2012-12-18 ENCOUNTER — Encounter: Payer: Self-pay | Admitting: Family Medicine

## 2012-12-18 VITALS — BP 147/82 | HR 66 | Ht 70.0 in | Wt 265.0 lb

## 2012-12-18 DIAGNOSIS — E785 Hyperlipidemia, unspecified: Secondary | ICD-10-CM

## 2012-12-18 DIAGNOSIS — R7301 Impaired fasting glucose: Secondary | ICD-10-CM

## 2012-12-18 DIAGNOSIS — F411 Generalized anxiety disorder: Secondary | ICD-10-CM

## 2012-12-18 DIAGNOSIS — I10 Essential (primary) hypertension: Secondary | ICD-10-CM

## 2012-12-18 DIAGNOSIS — F419 Anxiety disorder, unspecified: Secondary | ICD-10-CM

## 2012-12-18 LAB — HEMOGLOBIN A1C: Mean Plasma Glucose: 128 mg/dL — ABNORMAL HIGH (ref <117)

## 2012-12-18 MED ORDER — LISINOPRIL 30 MG PO TABS: 30.0000 mg | ORAL_TABLET | Freq: Every day | ORAL | Status: DC

## 2012-12-18 MED ORDER — SIMVASTATIN 40 MG PO TABS: 40.0000 mg | ORAL_TABLET | Freq: Every evening | ORAL | Status: DC

## 2012-12-18 NOTE — Progress Notes (Signed)
CC: Nathaniel Montes is a 57 y.o. male is here for Hypertension   Subjective: HPI:  Patient returns for routine followup in good spirits  Anxiety: Continues on Paxil 20 mg a day, describes occasional 5 minutes of frustration/anxiety that occurs maybe once a month, usually do to job stress. Denies mental disturbance, insomnia, depression, declining relationships at home nor the workplace. Describes his quality of life as great.  Essential hypertension: Has been taking lisinopril 20 mg on a daily basis without missed doses . No outside blood pressures to report. Denies cough, angioedema, shortness of breath, chest pain, irregular heartbeat, orthopnea, nor peripheral edema. Denies motor or sensory disturbances Impaired fasting glucose: At his last visit fasting glucose was above 100. He denies polyuria polyphasia or polydipsia. He tries to watch the eats but admits for room for improvement. No formal exercise program. Hyperlipidemia: At his last visit LDL was below a goal of 130. He continues on Zocor on a daily basis 40 mg with no recent missed doses. Denies right upper quadrant pain, myalgias, skin or scleral discoloration, nor limb claudication   Review Of Systems Outlined In HPI  Past Medical History  Diagnosis Date  . HYPERTENSION 11/29/2009  . HYPERLIPIDEMIA 11/29/2009  . Psoriasis 11/17/2012  . Anxiety 11/17/2012     No family history on file.   History  Substance Use Topics  . Smoking status: Former Smoker    Types: Cigarettes  . Smokeless tobacco: Not on file  . Alcohol Use: Yes     Objective: Filed Vitals:   12/18/12 0913  BP: 147/82  Pulse: 66    General: Alert and Oriented, No Acute Distress HEENT: Pupils equal, round, reactive to light. Conjunctivae clear.  External ears unremarkable, canals clearMoist mucous membranes, pharynx without inflammation nor lesions.  Neck supple without palpable lymphadenopathy nor abnormal masses. Lungs: Clear to auscultation  bilaterally, no wheezing/ronchi/rales.  Comfortable work of breathing. Good air movement. Cardiac: Regular rate and rhythm. Normal S1/S2.  No murmurs, rubs, nor gallops.  No carotid bruits Abdomen: Obese soft and nontender Extremities: No peripheral edema.  Strong peripheral pulses.  Mental Status: No depression, anxiety, nor agitation. Skin: Warm and dry.  Assessment & Plan: Dickie was seen today for hypertension.  Diagnoses and associated orders for this visit:  Anxiety  Hypertension - lisinopril (PRINIVIL,ZESTRIL) 30 MG tablet; Take 1 tablet (30 mg total) by mouth daily.  Impaired fasting glucose needs f/u a1c - HgB A1c  Hyperlipidemia - Discontinue: simvastatin (ZOCOR) 40 MG tablet; Take 1 tablet (40 mg total) by mouth every evening. - simvastatin (ZOCOR) 40 MG tablet; Take 1 tablet (40 mg total) by mouth every evening.    Essential hypertension: Uncontrolled, will increase lisinopril to 30 mg daily, stressed importance of lower salt intake and exercise Anxiety: Controlled, continue Paxil regimen Impaired fasting glucose: We'll further investigate with A1c today Hyperlipidemia: Clinically controlled not due for repeat lipid panel for another 9 months.  Return in about 3 months (around 03/18/2013).

## 2012-12-19 ENCOUNTER — Encounter: Payer: Self-pay | Admitting: Family Medicine

## 2012-12-19 DIAGNOSIS — R7303 Prediabetes: Secondary | ICD-10-CM | POA: Insufficient documentation

## 2012-12-30 ENCOUNTER — Other Ambulatory Visit: Payer: Self-pay | Admitting: *Deleted

## 2012-12-30 DIAGNOSIS — I10 Essential (primary) hypertension: Secondary | ICD-10-CM

## 2012-12-30 MED ORDER — LISINOPRIL 30 MG PO TABS: 30.0000 mg | ORAL_TABLET | Freq: Every day | ORAL | Status: DC

## 2013-01-21 ENCOUNTER — Encounter: Payer: Self-pay | Admitting: Family Medicine

## 2013-01-21 DIAGNOSIS — Z86010 Personal history of colonic polyps: Secondary | ICD-10-CM

## 2013-01-21 DIAGNOSIS — Z860101 Personal history of adenomatous and serrated colon polyps: Secondary | ICD-10-CM | POA: Insufficient documentation

## 2013-01-22 ENCOUNTER — Encounter: Payer: Self-pay | Admitting: *Deleted

## 2013-03-13 ENCOUNTER — Encounter: Payer: Self-pay | Admitting: Family Medicine

## 2013-03-13 ENCOUNTER — Ambulatory Visit (INDEPENDENT_AMBULATORY_CARE_PROVIDER_SITE_OTHER): Payer: Commercial Indemnity | Admitting: Family Medicine

## 2013-03-13 VITALS — BP 137/77 | HR 76 | Ht 70.0 in | Wt 264.0 lb

## 2013-03-13 DIAGNOSIS — R7303 Prediabetes: Secondary | ICD-10-CM

## 2013-03-13 DIAGNOSIS — F419 Anxiety disorder, unspecified: Secondary | ICD-10-CM

## 2013-03-13 DIAGNOSIS — I10 Essential (primary) hypertension: Secondary | ICD-10-CM

## 2013-03-13 DIAGNOSIS — R7309 Other abnormal glucose: Secondary | ICD-10-CM

## 2013-03-13 DIAGNOSIS — F411 Generalized anxiety disorder: Secondary | ICD-10-CM

## 2013-03-13 NOTE — Progress Notes (Signed)
CC: Nathaniel Montes is a 57 y.o. male is here for Hypertension   Subjective: HPI:  Followup hypertension: At last visit we increased lisinopril to 30 mg a day. Since then denies cough, angioedema, shortness of breath, chest pain, motor sensory disturbances, orthopnea, peripheral edema. Reports daily compliance  Followup anxiety: Continues on Paxil daily without any anxiety, depression, nor mental disturbance. He denies any side effects of taking a specifically any sexual side effects. He is quite happy with his mood.  Followup prediabetes: At his last visit A1c 6.1. Try to watch what he eats but admits for room for improvement. No formal exercise program.  Has lost some weight since last visit, 3 years ago A1c 6.0. Denies polyuria polyphasia or polydipsia. Denies poorly healing wounds, extremity discomfort, nor vision loss.   Review Of Systems Outlined In HPI  Past Medical History  Diagnosis Date  . HYPERTENSION 11/29/2009  . HYPERLIPIDEMIA 11/29/2009  . Psoriasis 11/17/2012  . Anxiety 11/17/2012     Family History  Problem Relation Age of Onset  . Hypertension Father      History  Substance Use Topics  . Smoking status: Former Smoker    Types: Cigarettes  . Smokeless tobacco: Not on file  . Alcohol Use: Yes     Objective: Filed Vitals:   03/13/13 0922  BP: 137/77  Pulse: 76    Vital signs reviewed. General: Alert and Oriented, No Acute Distress HEENT: Pupils equal, round, reactive to light. Conjunctivae clear.  External ears unremarkable.  Moist mucous membranes. Lungs: Clear and comfortable work of breathing, speaking in full sentences without accessory muscle use. No wheezing rhonchi nor rails Cardiac: Regular rate and rhythm. No murmurs rubs or gallops Neuro: CN II-XII grossly intact, gait normal. Extremities: No peripheral edema.  Strong peripheral pulses.  Mental Status: No depression, anxiety, nor agitation. Logical though process. Skin: Warm and  dry.  Assessment & Plan: Charlies was seen today for hypertension.  Diagnoses and associated orders for this visit:  Prediabetes  HYPERTENSION  Anxiety  Other Orders - Omega-3 Fatty Acids (FISH OIL) 1200 MG CAPS; Take by mouth.    Prediabetes: Clinically controlled, due for A1c repeat after the 19th of this month, patient will return for lab only visit at his convenience after this date. Hypertension, improved and controlled. Continue lisinopril 30 mg, will need BMP with A1c check in future. Anxiety: Control, continue Paxil  Return in about 3 months (around 06/12/2013).

## 2013-11-16 ENCOUNTER — Other Ambulatory Visit: Payer: Self-pay | Admitting: *Deleted

## 2013-11-16 DIAGNOSIS — F419 Anxiety disorder, unspecified: Secondary | ICD-10-CM

## 2013-11-16 DIAGNOSIS — E785 Hyperlipidemia, unspecified: Secondary | ICD-10-CM

## 2013-11-16 MED ORDER — PAROXETINE HCL 20 MG PO TABS: 20.0000 mg | ORAL_TABLET | ORAL | Status: DC

## 2013-11-16 MED ORDER — SIMVASTATIN 40 MG PO TABS: 40.0000 mg | ORAL_TABLET | Freq: Every evening | ORAL | Status: DC

## 2013-12-18 ENCOUNTER — Telehealth: Payer: Self-pay | Admitting: *Deleted

## 2013-12-18 DIAGNOSIS — I10 Essential (primary) hypertension: Secondary | ICD-10-CM

## 2013-12-18 MED ORDER — LISINOPRIL 30 MG PO TABS: 30.0000 mg | ORAL_TABLET | Freq: Every day | ORAL | Status: DC

## 2013-12-18 NOTE — Telephone Encounter (Signed)
Refill request for lisinopril. Pt has not been seen since 4/14 and was to f/u around 7/14. Sent in 30 day supply and called and left message on vm for pt to f/u with dr. Ileene Rubens sometime in the near future before 30 day supply runs out. Sent meds to [facility] [address] as listed in his chart.( pt does not have an appt on file)

## 2013-12-21 ENCOUNTER — Other Ambulatory Visit: Payer: Self-pay | Admitting: *Deleted

## 2013-12-21 DIAGNOSIS — I10 Essential (primary) hypertension: Secondary | ICD-10-CM

## 2013-12-21 MED ORDER — LISINOPRIL 30 MG PO TABS: 30.0000 mg | ORAL_TABLET | Freq: Every day | ORAL | Status: DC

## 2013-12-28 ENCOUNTER — Ambulatory Visit (INDEPENDENT_AMBULATORY_CARE_PROVIDER_SITE_OTHER): Payer: Commercial Indemnity | Admitting: Family Medicine

## 2013-12-28 ENCOUNTER — Encounter: Payer: Self-pay | Admitting: Family Medicine

## 2013-12-28 VITALS — BP 156/80 | HR 74 | Wt 269.0 lb

## 2013-12-28 DIAGNOSIS — M79609 Pain in unspecified limb: Secondary | ICD-10-CM

## 2013-12-28 DIAGNOSIS — R7303 Prediabetes: Secondary | ICD-10-CM

## 2013-12-28 DIAGNOSIS — F419 Anxiety disorder, unspecified: Secondary | ICD-10-CM

## 2013-12-28 DIAGNOSIS — F411 Generalized anxiety disorder: Secondary | ICD-10-CM

## 2013-12-28 DIAGNOSIS — R7309 Other abnormal glucose: Secondary | ICD-10-CM

## 2013-12-28 DIAGNOSIS — E785 Hyperlipidemia, unspecified: Secondary | ICD-10-CM

## 2013-12-28 DIAGNOSIS — Z79899 Other long term (current) drug therapy: Secondary | ICD-10-CM

## 2013-12-28 DIAGNOSIS — M79643 Pain in unspecified hand: Secondary | ICD-10-CM

## 2013-12-28 DIAGNOSIS — Z5181 Encounter for therapeutic drug level monitoring: Secondary | ICD-10-CM

## 2013-12-28 DIAGNOSIS — I10 Essential (primary) hypertension: Secondary | ICD-10-CM

## 2013-12-28 LAB — LIPID PANEL
Cholesterol: 165 mg/dL (ref 0–200)
HDL: 43 mg/dL (ref >39)
LDL CALC: 94 mg/dL (ref 0–99)
Total CHOL/HDL Ratio: 3.8 Ratio
Triglycerides: 138 mg/dL (ref <150)
VLDL: 28 mg/dL (ref 0–40)

## 2013-12-28 LAB — COMPLETE METABOLIC PANEL WITH GFR
ALBUMIN: 4.3 g/dL (ref 3.5–5.2)
ALT: 24 U/L (ref 0–53)
AST: 20 U/L (ref 0–37)
Alkaline Phosphatase: 65 U/L (ref 39–117)
BILIRUBIN TOTAL: 0.4 mg/dL (ref 0.3–1.2)
BUN: 21 mg/dL (ref 6–23)
CALCIUM: 9 mg/dL (ref 8.4–10.5)
CHLORIDE: 106 meq/L (ref 96–112)
CO2: 24 meq/L (ref 19–32)
Creat: 1.22 mg/dL (ref 0.50–1.35)
GFR, EST AFRICAN AMERICAN: 75 mL/min
GFR, EST NON AFRICAN AMERICAN: 64 mL/min
GLUCOSE: 112 mg/dL — AB (ref 70–99)
POTASSIUM: 4.6 meq/L (ref 3.5–5.3)
SODIUM: 141 meq/L (ref 135–145)
TOTAL PROTEIN: 6.8 g/dL (ref 6.0–8.3)

## 2013-12-28 LAB — HEMOGLOBIN A1C
Hgb A1c MFr Bld: 6.1 % — ABNORMAL HIGH (ref <5.7)
Mean Plasma Glucose: 128 mg/dL — ABNORMAL HIGH (ref <117)

## 2013-12-28 MED ORDER — LISINOPRIL-HYDROCHLOROTHIAZIDE 20-25 MG PO TABS: 1.0000 | ORAL_TABLET | Freq: Every day | ORAL | Status: DC

## 2013-12-28 MED ORDER — MELOXICAM 15 MG PO TABS: 15.0000 mg | ORAL_TABLET | Freq: Every day | ORAL | Status: DC | PRN

## 2013-12-28 MED ORDER — PAROXETINE HCL 20 MG PO TABS: 20.0000 mg | ORAL_TABLET | ORAL | Status: DC

## 2013-12-28 MED ORDER — SIMVASTATIN 40 MG PO TABS: 40.0000 mg | ORAL_TABLET | Freq: Every evening | ORAL | Status: DC

## 2013-12-28 NOTE — Progress Notes (Signed)
CC: Nathaniel Montes is a 58 y.o. male is here for Hypertension   Subjective: HPI:  Follow up hypertension: Continues on lisinopril 30 mg daily. No outside blood pressures to report. Denies and side effects. Denies chest pain shortness of breath orthopnea nor peripheral edema  followup hyperlipidemia: Continues on Zocor daily basis without missed doses denies myalgias, right upper quadrant pain. No formal exercise routine. Tries to watch what he eats but admits to 5 pound weight gain over the holidays. Followup anxiety: Continues on Paxil daily basis no known side effects no thoughts of wanting to harm self or others. States quality of life is fantastic from an anxiety standpoint which is now absent ever since starting on Paxil. Denies mental disturbance or depression Complains of 3 months of daily hand pain localized at the base of both thumbs. Worse at the end of the day. No interventions other than Aleve which has not helped. Denies swelling redness or warmth of either joint. Pain is symmetrical and mild in severity. Described only as pain and nonradiating      Review Of Systems Outlined In HPI  Past Medical History  Diagnosis Date  . HYPERTENSION 11/29/2009  . HYPERLIPIDEMIA 11/29/2009  . Psoriasis 11/17/2012  . Anxiety 11/17/2012     Family History  Problem Relation Age of Onset  . Hypertension Father      History  Substance Use Topics  . Smoking status: Former Smoker    Types: Cigarettes  . Smokeless tobacco: Not on file  . Alcohol Use: Yes     Objective: Filed Vitals:   12/28/13 0857  BP: 156/80  Pulse: 74    General: Alert and Oriented, No Acute Distress HEENT: Pupils equal, round, reactive to light. Conjunctivae clear.   moist mucous membranes pharynx unremarkable  Lungs: Clear to auscultation bilaterally, no wheezing/ronchi/rales.  Comfortable work of breathing. Good air movement. Cardiac: Regular rate and rhythm. Normal S1/S2.  No murmurs, rubs, nor gallops.    Extremities: No peripheral edema.  Strong peripheral pulses.  full range of motion strength of both thumbs there is no overlying redness warmth or tenderness to palpation. No pain in anatomical snuff box, Finkelstein's is negative.  Mental Status: No depression, anxiety, nor agitation. Skin: Warm and dry.  Assessment & Plan: Nathaniel Montes was seen today for hypertension.  Diagnoses and associated orders for this visit:  Prediabetes - Hemoglobin A1c  HYPERTENSION - COMPLETE METABOLIC PANEL WITH GFR  Hyperlipidemia LDL goal < 130 FramRisk 11% Dec 2013 - Lipid panel  Encounter for monitoring statin therapy - Lipid panel - COMPLETE METABOLIC PANEL WITH GFR  Hand pain - meloxicam (MOBIC) 15 MG tablet; Take 1 tablet (15 mg total) by mouth daily as needed for pain.  Anxiety - PARoxetine (PAXIL) 20 MG tablet; Take 1 tablet (20 mg total) by mouth every morning.  Hyperlipidemia - simvastatin (ZOCOR) 40 MG tablet; Take 1 tablet (40 mg total) by mouth every evening.  Other Orders - lisinopril-hydrochlorothiazide (PRINZIDE,ZESTORETIC) 20-25 MG per tablet; Take 1 tablet by mouth daily.    Prediabetes: Due for repeat A1c   essential hypertension: Uncontrolled add in hydrochlorothiazide to his lisinopril Hyperlipidemia: Due for repeat lipid panel with liver function Anxiety: Controlled continue Paxil Hand pain: Start meloxicam if not improving after one to 2 weeks and if interfering with quality of life will refer to Dr. Darene Montes. in sports medicine  Return in about 3 months (around 03/28/2014).

## 2014-07-14 ENCOUNTER — Telehealth: Payer: Self-pay | Admitting: Family Medicine

## 2014-07-14 NOTE — Telephone Encounter (Signed)
Pt is over due for f/u appt. Left message on vm to call office to schedule f/u appt

## 2014-07-14 NOTE — Telephone Encounter (Signed)
Patient walked-in this morning request to get a med refill of Lisinopril 20-25 mg faxed to [facility] on [address] Thanks

## 2014-07-22 ENCOUNTER — Ambulatory Visit (INDEPENDENT_AMBULATORY_CARE_PROVIDER_SITE_OTHER): Payer: Commercial Indemnity | Admitting: Family Medicine

## 2014-07-22 ENCOUNTER — Encounter: Payer: Self-pay | Admitting: Family Medicine

## 2014-07-22 VITALS — BP 152/91 | HR 73 | Wt 261.0 lb

## 2014-07-22 DIAGNOSIS — F419 Anxiety disorder, unspecified: Secondary | ICD-10-CM

## 2014-07-22 DIAGNOSIS — M19042 Primary osteoarthritis, left hand: Secondary | ICD-10-CM

## 2014-07-22 DIAGNOSIS — M19049 Primary osteoarthritis, unspecified hand: Secondary | ICD-10-CM

## 2014-07-22 DIAGNOSIS — R7303 Prediabetes: Secondary | ICD-10-CM

## 2014-07-22 DIAGNOSIS — E785 Hyperlipidemia, unspecified: Secondary | ICD-10-CM

## 2014-07-22 DIAGNOSIS — I10 Essential (primary) hypertension: Secondary | ICD-10-CM

## 2014-07-22 DIAGNOSIS — M19041 Primary osteoarthritis, right hand: Secondary | ICD-10-CM | POA: Insufficient documentation

## 2014-07-22 DIAGNOSIS — R7309 Other abnormal glucose: Secondary | ICD-10-CM

## 2014-07-22 DIAGNOSIS — F411 Generalized anxiety disorder: Secondary | ICD-10-CM

## 2014-07-22 LAB — LIPID PANEL
CHOL/HDL RATIO: 3.2 ratio
Cholesterol: 155 mg/dL (ref 0–200)
HDL: 48 mg/dL (ref >39)
LDL Cholesterol: 90 mg/dL (ref 0–99)
TRIGLYCERIDES: 85 mg/dL (ref <150)
VLDL: 17 mg/dL (ref 0–40)

## 2014-07-22 LAB — HEMOGLOBIN A1C
Hgb A1c MFr Bld: 6.2 % — ABNORMAL HIGH (ref <5.7)
MEAN PLASMA GLUCOSE: 131 mg/dL — AB (ref <117)

## 2014-07-22 MED ORDER — SIMVASTATIN 40 MG PO TABS: 40.0000 mg | ORAL_TABLET | Freq: Every evening | ORAL | Status: DC

## 2014-07-22 MED ORDER — LISINOPRIL-HYDROCHLOROTHIAZIDE 20-25 MG PO TABS: 1.0000 | ORAL_TABLET | Freq: Every day | ORAL | Status: DC

## 2014-07-22 MED ORDER — CELECOXIB 200 MG PO CAPS: 200.0000 mg | ORAL_CAPSULE | Freq: Every day | ORAL | Status: DC

## 2014-07-22 MED ORDER — PAROXETINE HCL 20 MG PO TABS: 20.0000 mg | ORAL_TABLET | ORAL | Status: DC

## 2014-07-22 NOTE — Progress Notes (Signed)
CC: Nathaniel Montes is a 58 y.o. male is here for Hypertension   Subjective: HPI:  Followup anxiety: Patient states that life is great ever since starting on Paxil and has had no known side effects. Denies anxiety, depression, nor mental disturbance.  Followup hypertension: Since he started hydrochlorothiazide-lisinopril he has no outside blood pressures to report and states that he felt prickly fine without any side effects on the medication. He ran out of it 2 weeks ago and has been using his old formulation of lisinopril 30 mg on a daily basis. Denies chest pain shortness of breath orthopnea nor peripheral edema  Followup prediabetes: Continues to avoid sodas, or dietary changes. No unintentional weight loss or gain. Denies polyuria polyphasia or polydipsia  Followup hyperlipidemia: Continues to take Zocor and a daily basis without side effects or myalgias nor right upper quadrant pain.  Complains of bilateral hand pain localized to the base of the thumbs is worse first thing in the morning and improves the more he uses it or warmth of his hand. Denies joint pain elsewhere. States that meloxicam was not much benefit. Denies swelling redness or warmth at the site of discomfort   Review Of Systems Outlined In HPI  Past Medical History  Diagnosis Date  . HYPERTENSION 11/29/2009  . HYPERLIPIDEMIA 11/29/2009  . Psoriasis 11/17/2012  . Anxiety 11/17/2012    No past surgical history on file. Family History  Problem Relation Age of Onset  . Hypertension Father     History   Social History  . Marital Status: Married    Spouse Name: N/A    Number of Children: N/A  . Years of Education: N/A   Occupational History  . Not on file.   Social History Main Topics  . Smoking status: Former Smoker    Types: Cigarettes  . Smokeless tobacco: Not on file  . Alcohol Use: Yes  . Drug Use: No  . Sexual Activity: Not on file   Other Topics Concern  . Not on file   Social History  Narrative  . No narrative on file     Objective: BP 152/91  Pulse 73  Wt 261 lb (118.389 kg)  General: Alert and Oriented, No Acute Distress HEENT: Pupils equal, round, reactive to light. Conjunctivae clear.  Moist membranes pharynx unremarkable Lungs: Clear to auscultation bilaterally, no wheezing/ronchi/rales.  Comfortable work of breathing. Good air movement. Cardiac: Regular rate and rhythm. Normal S1/S2.  No murmurs, rubs, nor gallops.   Extremities: No peripheral edema.  Strong peripheral pulses. Full range of motion strength in both hands and fingers, there is mild swelling at the base of the thumbs with tenderness to palpation bilaterally but no overlying redness or warmth Mental Status: No depression, anxiety, nor agitation. Skin: Warm and dry.  Assessment & Plan: [name] was seen today for hypertension.  Diagnoses and associated orders for this visit:  Anxiety - PARoxetine (PAXIL) 20 MG tablet; Take 1 tablet (20 mg total) by mouth every morning.  HYPERTENSION - lisinopril-hydrochlorothiazide (PRINZIDE,ZESTORETIC) 20-25 MG per tablet; Take 1 tablet by mouth daily.  Prediabetes - HgB A1c  Primary osteoarthritis of both hands - celecoxib (CELEBREX) 200 MG capsule; Take 1 capsule (200 mg total) by mouth daily.  Hyperlipidemia - simvastatin (ZOCOR) 40 MG tablet; Take 1 tablet (40 mg total) by mouth every evening. - Lipid panel    Anxiety: Controlled continue Paxil Hypertension: Controlled restart lisinopril-hydrochlorothiazide, I've asked him to write me with blood pressures at home next one to 2  weeks Prediabetes: Clinical controlled due for A1c Hyperlipidemia: Due for lipid panel continue simvastatin pending results Osteoarthritis: Start Celebrex, savings voucher provided  Return in about 3 months (around 10/22/2014).

## 2014-07-31 ENCOUNTER — Other Ambulatory Visit: Payer: Self-pay | Admitting: Family Medicine

## 2014-08-20 ENCOUNTER — Telehealth: Payer: Self-pay | Admitting: Family Medicine

## 2014-08-20 DIAGNOSIS — M19041 Primary osteoarthritis, right hand: Secondary | ICD-10-CM

## 2014-08-20 DIAGNOSIS — M19042 Primary osteoarthritis, left hand: Principal | ICD-10-CM

## 2014-08-20 MED ORDER — CELECOXIB 200 MG PO CAPS: 200.0000 mg | ORAL_CAPSULE | Freq: Every day | ORAL | Status: DC

## 2014-08-20 NOTE — Telephone Encounter (Signed)
Rx sent 

## 2014-08-20 NOTE — Telephone Encounter (Signed)
Message left

## 2014-08-20 NOTE — Telephone Encounter (Signed)
Patient states that he was supposed to let you know IF the Celecoxib 200 mg is working  And he needs you to call in a [facility] day supply to [facility] [address]

## 2015-03-02 ENCOUNTER — Other Ambulatory Visit: Payer: Self-pay | Admitting: Family Medicine

## 2015-04-07 ENCOUNTER — Telehealth: Payer: Self-pay | Admitting: *Deleted

## 2015-04-07 ENCOUNTER — Other Ambulatory Visit: Payer: Self-pay | Admitting: Family Medicine

## 2015-04-07 NOTE — Telephone Encounter (Signed)
LVM informing pt that he was to Return in about 3 months (around 10/22/2014) according to his last OV. Asked that he call our office and speak with someone in scheduling to make a f/u appt for his BP and DM f/u. Marland KitchenAudelia Hives [place]

## 2015-08-04 ENCOUNTER — Encounter: Payer: Self-pay | Admitting: Gastroenterology

## 2015-09-13 ENCOUNTER — Telehealth: Payer: Self-pay | Admitting: Family Medicine

## 2015-09-13 MED ORDER — PAROXETINE HCL 20 MG PO TABS: 20.0000 mg | ORAL_TABLET | Freq: Every morning | ORAL | Status: DC

## 2015-09-13 NOTE — Telephone Encounter (Signed)
Patient requesting a refill for a week of Paxil 20 mg.  He has an appt with Dr. Ileene Rubens next Tuesday.  I told him that he may be denied refill since it was noted in his chart that he needed an appt before more refills.  [facility]

## 2015-09-13 NOTE — Telephone Encounter (Signed)
Medication sent.

## 2015-09-20 ENCOUNTER — Encounter: Payer: Self-pay | Admitting: Family Medicine

## 2015-09-20 ENCOUNTER — Ambulatory Visit (INDEPENDENT_AMBULATORY_CARE_PROVIDER_SITE_OTHER): Payer: Managed Care, Other (non HMO) | Admitting: Family Medicine

## 2015-09-20 VITALS — BP 144/66 | HR 59 | Wt 255.0 lb

## 2015-09-20 DIAGNOSIS — R7303 Prediabetes: Secondary | ICD-10-CM | POA: Diagnosis not present

## 2015-09-20 DIAGNOSIS — I10 Essential (primary) hypertension: Secondary | ICD-10-CM | POA: Diagnosis not present

## 2015-09-20 DIAGNOSIS — Z86010 Personal history of colonic polyps: Secondary | ICD-10-CM | POA: Diagnosis not present

## 2015-09-20 DIAGNOSIS — F419 Anxiety disorder, unspecified: Secondary | ICD-10-CM | POA: Diagnosis not present

## 2015-09-20 LAB — BASIC METABOLIC PANEL WITH GFR
BUN: 15 mg/dL (ref 7–25)
CO2: 27 mmol/L (ref 20–31)
Calcium: 9.4 mg/dL (ref 8.6–10.3)
Chloride: 104 mmol/L (ref 98–110)
Creat: 1.06 mg/dL (ref 0.70–1.25)
GFR, EST AFRICAN AMERICAN: 88 mL/min (ref >=60)
GFR, EST NON AFRICAN AMERICAN: 76 mL/min (ref >=60)
Glucose, Bld: 138 mg/dL — ABNORMAL HIGH (ref 65–99)
POTASSIUM: 5 mmol/L (ref 3.5–5.3)
SODIUM: 139 mmol/L (ref 135–146)

## 2015-09-20 LAB — HEMOGLOBIN A1C
HEMOGLOBIN A1C: 5.9 % — AB (ref <5.7)
MEAN PLASMA GLUCOSE: 123 mg/dL — AB (ref <117)

## 2015-09-20 MED ORDER — PAROXETINE HCL 20 MG PO TABS: 20.0000 mg | ORAL_TABLET | Freq: Every morning | ORAL | Status: DC

## 2015-09-20 MED ORDER — LISINOPRIL-HYDROCHLOROTHIAZIDE 20-25 MG PO TABS: 1.0000 | ORAL_TABLET | Freq: Every day | ORAL | Status: DC

## 2015-09-20 MED ORDER — ZOSTER VACCINE LIVE 19400 UNT/0.65ML ~~LOC~~ SOLR: 0.6500 mL | Freq: Once | SUBCUTANEOUS | Status: DC

## 2015-09-20 MED ORDER — SIMVASTATIN 40 MG PO TABS: 40.0000 mg | ORAL_TABLET | Freq: Every evening | ORAL | Status: DC

## 2015-09-20 NOTE — Progress Notes (Addendum)
CC: Nathaniel Montes is a 58 y.o. male is here for Medication Follow Up   Subjective: HPI:  Follow-up essential hypertension: Ran out of lisinopril-HCTZ, continues to take this on a daily basis when he has it in his possession. No outside blood pressures report. No chest pain shortness of breath orthopnea nor peripheral edema  Follow-up prediabetes: Currently trying to stay active on a daily basis and watch his sugar intake. No polyuria or polyphagia or polydipsia.  He has a history of colon polyps and will be due for repeat colonoscopy next month. He has not set this up yet. He denies any unintentional weight loss, abdominal pain or blood in stool.  He is requesting refills on Paxil. He's been taking this for matter of years and is extremely happy with this medication believing that it is preventing any degree of anxiety in his life. He denies any known side effects. No depression.   Review Of Systems Outlined In HPI  Past Medical History  Diagnosis Date  . HYPERTENSION 11/29/2009  . HYPERLIPIDEMIA 11/29/2009  . Psoriasis 11/17/2012  . Anxiety 11/17/2012    No past surgical history on file. Family History  Problem Relation Age of Onset  . Hypertension Father     Social History   Social History  . Marital Status: Married    Spouse Name: N/A  . Number of Children: N/A  . Years of Education: N/A   Occupational History  . Not on file.   Social History Main Topics  . Smoking status: Former Smoker    Types: Cigarettes  . Smokeless tobacco: Not on file  . Alcohol Use: Yes  . Drug Use: No  . Sexual Activity: Not on file   Other Topics Concern  . Not on file   Social History Narrative     Objective: BP 144/66 mmHg  Pulse 59  Wt 255 lb (115.667 kg)  General: Alert and Oriented, No Acute Distress HEENT: Pupils equal, round, reactive to light. Conjunctivae clear.  Moist mucous membranes Lungs: Clear to auscultation bilaterally, no wheezing/ronchi/rales.  Comfortable  work of breathing. Good air movement. Cardiac: Regular rate and rhythm. Normal S1/S2.  No murmurs, rubs, nor gallops.   Extremities: No peripheral edema.  Strong peripheral pulses.  Mental Status: No depression, anxiety, nor agitation. Skin: Warm and dry.  Assessment & Plan: Nathaniel Montes was seen today for medication follow up.  Diagnoses and all orders for this visit:  Essential hypertension -     BASIC METABOLIC PANEL WITH GFR  Prediabetes -     Hemoglobin A1c  History of adenomatous polyp of colon -     Ambulatory referral to Gastroenterology  Anxiety  Other orders -     Discontinue: PARoxetine (PAXIL) 20 MG tablet; Take 1 tablet (20 mg total) by mouth every morning. -     Discontinue: lisinopril-hydrochlorothiazide (PRINZIDE,ZESTORETIC) 20-25 MG tablet; Take 1 tablet by mouth daily. -     Discontinue: simvastatin (ZOCOR) 40 MG tablet; Take 1 tablet (40 mg total) by mouth every evening. -     zoster vaccine live, PF, (ZOSTAVAX) 14481 UNT/0.65ML injection; Inject 19,400 Units into the skin once. -     lisinopril-hydrochlorothiazide (PRINZIDE,ZESTORETIC) 20-25 MG tablet; Take 1 tablet by mouth daily. -     PARoxetine (PAXIL) 20 MG tablet; Take 1 tablet (20 mg total) by mouth every morning. -     simvastatin (ZOCOR) 40 MG tablet; Take 1 tablet (40 mg total) by mouth every evening.   Essential hypertension: Uncontrolled  chronic condition since running out of medication. Refills were provided and encouraged to follow-up within one week of blood pressure outside of our office is 140/90 or greater. Checking renal function. Continue simvastatin Prediabetes: Clinically controlled but due for A1c History of colon polyps: Referral to colonoscopy center has been arranged Anxiety: Controlled with Paxil  Provided with a prescription for the shingles vaccine to take to his local pharmacy.   Return in about 3 months (around 12/21/2015).

## 2015-10-11 ENCOUNTER — Encounter: Payer: Self-pay | Admitting: Gastroenterology

## 2016-01-07 ENCOUNTER — Emergency Department (INDEPENDENT_AMBULATORY_CARE_PROVIDER_SITE_OTHER)
Admission: EM | Admit: 2016-01-07 | Discharge: 2016-01-07 | Disposition: A | Discharge status: Home / Self Care | Payer: Managed Care, Other (non HMO) | Source: Home / Self Care | Attending: Family Medicine | Admitting: Family Medicine

## 2016-01-07 ENCOUNTER — Encounter: Payer: Self-pay | Admitting: Emergency Medicine

## 2016-01-07 DIAGNOSIS — J209 Acute bronchitis, unspecified: Secondary | ICD-10-CM | POA: Diagnosis not present

## 2016-01-07 LAB — POCT CBC W AUTO DIFF (K'VILLE URGENT CARE)

## 2016-01-07 LAB — POCT INFLUENZA A/B
INFLUENZA A, POC: NEGATIVE
INFLUENZA B, POC: NEGATIVE

## 2016-01-07 MED ORDER — DOXYCYCLINE HYCLATE 100 MG PO CAPS: 100.0000 mg | ORAL_CAPSULE | Freq: Two times a day (BID) | ORAL | Status: DC

## 2016-01-07 NOTE — ED Notes (Signed)
Pt states that he has been sick since Tuesday, fever, body sweats, just felt bad.  Some cough, no congestion.

## 2016-01-07 NOTE — ED Provider Notes (Signed)
CSN: [id number]     Arrival date & time 01/07/16  [id number] History   First MD Initiated Contact with Patient 01/07/16 [phone]     Chief Complaint  Patient presents with  . URI      HPI Comments: Complains of 4 day history flu-like illness including headache, fever/chills, fatigue, and cough.  He denies nasal congestion and sore throat.  Cough is non-productive and somewhat worse at night.  No pleuritic pain or shortness of breath.  He has not had a flu shot this season.  No GI or GU symptoms   The history is provided by the patient.    Past Medical History  Diagnosis Date  . HYPERTENSION 11/29/2009  . HYPERLIPIDEMIA 11/29/2009  . Psoriasis 11/17/2012  . Anxiety 11/17/2012   History reviewed. No pertinent past surgical history. Family History  Problem Relation Age of Onset  . Hypertension Father    Social History  Substance Use Topics  . Smoking status: Former Smoker    Types: Cigarettes  . Smokeless tobacco: None  . Alcohol Use: Yes    Review of Systems No sore throat + cough No pleuritic pain No wheezing No nasal congestion No post-nasal drainage No sinus pain/pressure No itchy/red eyes No earache No hemoptysis No SOB + fever, + chills/sweats No nausea No vomiting No abdominal pain No diarrhea No urinary symptoms No skin rash + fatigue No myalgias + headache Used OTC meds without relief  Allergies  Review of patient's allergies indicates no known allergies.  Home Medications   Prior to Admission medications   Medication Sig Start Date End Date Taking? Authorizing Provider  doxycycline (VIBRAMYCIN) 100 MG capsule Take 1 capsule (100 mg total) by mouth 2 (two) times daily. Take with food. 01/07/16   Kandra Nicolas, MD  etanercept (ENBREL) 50 MG/ML injection Inject 50 mg into the skin once a week.    Historical Provider, MD  lisinopril-hydrochlorothiazide (PRINZIDE,ZESTORETIC) 20-25 MG tablet Take 1 tablet by mouth daily. 09/20/15   Sean Hommel, DO  PARoxetine  (PAXIL) 20 MG tablet Take 1 tablet (20 mg total) by mouth every morning. 09/20/15   Sean Hommel, DO  simvastatin (ZOCOR) 40 MG tablet Take 1 tablet (40 mg total) by mouth every evening. 09/20/15   Marcial Pacas, DO   Meds Ordered and Administered this Visit  Medications - No data to display  BP 104/62 mmHg  Pulse 128  Temp(Src) 98.9 F (37.2 C) (Oral)  Ht 5\' 10"  (1.778 m)  Wt 250 lb 12.8 oz (113.762 kg)  BMI 35.99 kg/m2  SpO2 94% No data found.   Physical Exam Nursing notes and Vital Signs reviewed. Appearance:  Patient appears stated age, and in no acute distress Eyes:  Pupils are equal, round, and reactive to light and accomodation.  Extraocular movement is intact.  Conjunctivae are not inflamed  Ears:  Canals normal.  Tympanic membranes normal.  Nose:  Mildly congested turbinates.  No sinus tenderness.  Pharynx:  Normal; moist mucous membranes  Neck:  Supple.  No adenopathy Lungs:  Clear to auscultation.  Breath sounds are equal.  Moving air well. Heart:  Regular rate and rhythm without murmurs, rubs, or gallops.  Rate 88 Abdomen:  Nontender without masses or hepatosplenomegaly.  Bowel sounds are present.  No CVA or flank tenderness.  Extremities:  No edema.  Skin:  No rash present.   ED Course  Procedures  None    Labs Reviewed  POCT CBC W AUTO DIFF ([facility]) - Abnormal;  Notable for the following:  WBC 12.8; LY 8.3; MO 5.0; GR 86.7; Hgb 14.7; Platelets 237   POCT INFLUENZA A/B negative     MDM   1. Acute bronchitis, unspecified organism; note leukocytosis (WBC 12.8)    Begin doxycycline 100mg  BID for atypical coverage.  Take plain guaifenesin (1200mg  extended release tabs such as Mucinex) twice daily, with plenty of water, for cough and congestion.  Get adequate rest.   May use Afrin nasal spray (or generic oxymetazoline) twice daily for about 5 days and then discontinue.  Also recommend using saline nasal spray several times daily and saline nasal irrigation  (AYR is a common brand).   Try warm salt water gargles for sore throat.  May take Delsym Cough Suppressant at bedtime for nighttime cough.  Stop all antihistamines for now, and other non-prescription cough/cold preparations. May take Ibuprofen 200mg , 4 tabs every 8 hours with food for fever, body aches, etc.   Follow-up with family doctor if not improving about10 days.     Kandra Nicolas, MD 01/07/16 1155

## 2016-01-07 NOTE — Discharge Instructions (Signed)
Take plain guaifenesin (1200mg  extended release tabs such as Mucinex) twice daily, with plenty of water, for cough and congestion.  Get adequate rest.   May use Afrin nasal spray (or generic oxymetazoline) twice daily for about 5 days and then discontinue.  Also recommend using saline nasal spray several times daily and saline nasal irrigation (AYR is a common brand).   Try warm salt water gargles for sore throat.  May take Delsym Cough Suppressant at bedtime for nighttime cough.  Stop all antihistamines for now, and other non-prescription cough/cold preparations. May take Ibuprofen 200mg , 4 tabs every 8 hours with food for fever, body aches, etc.   Follow-up with family doctor if not improving about10 days.

## 2016-01-30 ENCOUNTER — Telehealth: Payer: Self-pay | Admitting: Family Medicine

## 2016-01-30 ENCOUNTER — Other Ambulatory Visit: Payer: Self-pay

## 2016-01-30 MED ORDER — SIMVASTATIN 40 MG PO TABS: 40.0000 mg | ORAL_TABLET | Freq: Every evening | ORAL | Status: DC

## 2016-01-30 NOTE — Telephone Encounter (Signed)
[facility]  He also wants additional refills sent to his mail order pharmacy.    Thanks.

## 2016-01-30 NOTE — Telephone Encounter (Signed)
Please patient know this has been completed, I need him to return early summer.

## 2016-01-31 NOTE — Telephone Encounter (Signed)
Pt.notified

## 2016-04-25 ENCOUNTER — Other Ambulatory Visit: Payer: Self-pay | Admitting: Family Medicine

## 2016-04-25 ENCOUNTER — Telehealth: Payer: Self-pay | Admitting: *Deleted

## 2016-04-25 DIAGNOSIS — E785 Hyperlipidemia, unspecified: Secondary | ICD-10-CM

## 2016-04-25 NOTE — Telephone Encounter (Signed)
Refills sent for zocor. Pt has not had labs drawn for chol since 2015. Left message on patients vm to go by the lab and get blood work done.order placed

## 2016-07-08 ENCOUNTER — Other Ambulatory Visit: Payer: Self-pay | Admitting: Family Medicine

## 2016-09-01 ENCOUNTER — Other Ambulatory Visit: Payer: Self-pay | Admitting: Family Medicine

## 2016-09-03 ENCOUNTER — Other Ambulatory Visit: Payer: Self-pay | Admitting: Family Medicine

## 2016-10-15 ENCOUNTER — Other Ambulatory Visit: Payer: Self-pay | Admitting: Family Medicine

## 2016-10-18 ENCOUNTER — Ambulatory Visit (INDEPENDENT_AMBULATORY_CARE_PROVIDER_SITE_OTHER): Payer: Managed Care, Other (non HMO) | Admitting: Osteopathic Medicine

## 2016-10-18 ENCOUNTER — Encounter: Payer: Self-pay | Admitting: Osteopathic Medicine

## 2016-10-18 VITALS — BP 153/77 | HR 82 | Ht 70.0 in | Wt 262.0 lb

## 2016-10-18 DIAGNOSIS — E785 Hyperlipidemia, unspecified: Secondary | ICD-10-CM

## 2016-10-18 DIAGNOSIS — R7303 Prediabetes: Secondary | ICD-10-CM

## 2016-10-18 DIAGNOSIS — F419 Anxiety disorder, unspecified: Secondary | ICD-10-CM

## 2016-10-18 DIAGNOSIS — I10 Essential (primary) hypertension: Secondary | ICD-10-CM | POA: Diagnosis not present

## 2016-10-18 LAB — CBC WITH DIFFERENTIAL/PLATELET
BASOS ABS: 49 {cells}/uL (ref 0–200)
Basophils Relative: 1 %
EOS ABS: 49 {cells}/uL (ref 15–500)
Eosinophils Relative: 1 %
HEMATOCRIT: 43.5 % (ref 38.5–50.0)
HEMOGLOBIN: 14.8 g/dL (ref 13.2–17.1)
LYMPHS ABS: 1617 {cells}/uL (ref 850–3900)
LYMPHS PCT: 33 %
MCH: 33.1 pg — ABNORMAL HIGH (ref 27.0–33.0)
MCHC: 34 g/dL (ref 32.0–36.0)
MCV: 97.3 fL (ref 80.0–100.0)
MONO ABS: 588 {cells}/uL (ref 200–950)
MPV: 10.5 fL (ref 7.5–12.5)
Monocytes Relative: 12 %
NEUTROS PCT: 53 %
Neutro Abs: 2597 cells/uL (ref 1500–7800)
Platelets: 319 10*3/uL (ref 140–400)
RBC: 4.47 MIL/uL (ref 4.20–5.80)
RDW: 13.4 % (ref 11.0–15.0)
WBC: 4.9 10*3/uL (ref 3.8–10.8)

## 2016-10-18 LAB — COMPLETE METABOLIC PANEL WITH GFR
ALBUMIN: 4.2 g/dL (ref 3.6–5.1)
ALK PHOS: 60 U/L (ref 40–115)
ALT: 17 U/L (ref 9–46)
AST: 20 U/L (ref 10–35)
BUN: 13 mg/dL (ref 7–25)
CALCIUM: 9.5 mg/dL (ref 8.6–10.3)
CHLORIDE: 106 mmol/L (ref 98–110)
CO2: 25 mmol/L (ref 20–31)
CREATININE: 0.97 mg/dL (ref 0.70–1.25)
GFR, Est Non African American: 84 mL/min (ref >=60)
Glucose, Bld: 119 mg/dL — ABNORMAL HIGH (ref 65–99)
POTASSIUM: 4.7 mmol/L (ref 3.5–5.3)
Sodium: 140 mmol/L (ref 135–146)
Total Bilirubin: 0.4 mg/dL (ref 0.2–1.2)
Total Protein: 6.8 g/dL (ref 6.1–8.1)

## 2016-10-18 LAB — LIPID PANEL
CHOL/HDL RATIO: 5.5 ratio — AB (ref <5.0)
CHOLESTEROL: 219 mg/dL — AB (ref <200)
HDL: 40 mg/dL — ABNORMAL LOW (ref >40)
LDL Cholesterol: 153 mg/dL — ABNORMAL HIGH (ref <100)
TRIGLYCERIDES: 130 mg/dL (ref <150)
VLDL: 26 mg/dL (ref <30)

## 2016-10-18 LAB — TSH: TSH: 1.03 mIU/L (ref 0.40–4.50)

## 2016-10-18 MED ORDER — PAROXETINE HCL 20 MG PO TABS
20.0000 mg | ORAL_TABLET | Freq: Every morning | ORAL | 0 refills | Status: DC
Start: 2016-10-18 — End: 2016-10-18

## 2016-10-18 MED ORDER — PAROXETINE HCL 20 MG PO TABS: 20.0000 mg | ORAL_TABLET | Freq: Every morning | ORAL | 3 refills | Status: DC

## 2016-10-18 MED ORDER — ZOSTER VACCINE LIVE 19400 UNT/0.65ML ~~LOC~~ SUSR: 0.6500 mL | Freq: Once | SUBCUTANEOUS | 0 refills | Status: AC

## 2016-10-18 MED ORDER — LISINOPRIL-HYDROCHLOROTHIAZIDE 20-25 MG PO TABS: 1.0000 | ORAL_TABLET | Freq: Every day | ORAL | 0 refills | Status: DC

## 2016-10-18 MED ORDER — SIMVASTATIN 40 MG PO TABS: 40.0000 mg | ORAL_TABLET | Freq: Every evening | ORAL | 0 refills | Status: DC

## 2016-10-18 NOTE — Progress Notes (Signed)
HPI: Nathaniel Montes is a 60 y.o. male  who [facility]18/16,  for chief complaint of:  Chief Complaint  Patient presents with  . Establish Care  . Follow-up    Medication refills for blood pressure, cholesterol, anxiety. Due for prediabetes check    Hypertension: Patient states he is out of lisinopril-HCTZ, says that he was using some of his old medicine, thinks it was just lisinopril not the combination. No chest pain, pressure, shortness of breath.  Hyperlipidemia: Has been out of cholesterol medication for about a week. No history of heart disease  Anxiety: Requests refill of Paxil, doing well on this medication for several years  Prediabetes: Due for A1c recheck. No polyuria/polydipsia.   Most recent office visit reviewed. Patient was seen over one year ago, 09/20/2015 for follow-up on hypertension, prediabetes, anxiety. At that time patient was taking lisinopril-HCTZ 20-25, simvastatin 40, Paxil 20, prescription was given for zoster vaccine. Labs at that time, elevated blood glucose at 138, kidney function okay with creatinine 1.06, IMA globin A1c was 5.9, no lipid panel on file since 2015 at that point levels were looking okay with total cholesterol 155 LDL 90 HDL 48.    Past medical, surgical, social and family history reviewed: Patient Active Problem List   Diagnosis Date Noted  . Osteoarthritis of both hands 07/22/2014  . History of adenomatous polyp of colon 01/21/2013  . Prediabetes 12/19/2012  . Anxiety 11/17/2012  . Psoriasis 11/17/2012  . Hyperlipidemia LDL goal < 130 FramRisk 11% Dec 2013 11/29/2009  . Essential hypertension 11/29/2009   No past surgical history on file. Social History  Substance Use Topics  . Smoking status: Former Smoker    Types: Cigarettes  . Smokeless tobacco: Never Used  . Alcohol use Yes   Family History  Problem Relation Age of Onset  . Hypertension Father      Current  medication list and allergy/intolerance information reviewed:   Current Outpatient Prescriptions on File Prior to Visit  Medication Sig Dispense Refill  . doxycycline (VIBRAMYCIN) 100 MG capsule Take 1 capsule (100 mg total) by mouth 2 (two) times daily. Take with food. 20 capsule 0  . etanercept (ENBREL) 50 MG/ML injection Inject 50 mg into the skin once a week.    Marland Kitchen lisinopril-hydrochlorothiazide (PRINZIDE,ZESTORETIC) 20-25 MG tablet Take 1 tablet by mouth daily. 90 tablet 1  . lisinopril-hydrochlorothiazide (PRINZIDE,ZESTORETIC) 20-25 MG tablet TAKE 1 TABLET BY MOUTH DAILY. NEED FOLLOW UP APPOINTMENT FOR MORE REFILLS 7 tablet 0  . PARoxetine (PAXIL) 20 MG tablet Take 1 tablet (20 mg total) by mouth every morning. 90 tablet 1  . PARoxetine (PAXIL) 20 MG tablet Take 1 tablet (20 mg total) by mouth every morning. NEED FOLLOW UP APPOINTMENT FOR MORE REFILLS 30 tablet 0  . simvastatin (ZOCOR) 40 MG tablet Take 1 tablet (40 mg total) by mouth every evening. Follow up for blood work Summer 2017 90 tablet 1  . simvastatin (ZOCOR) 40 MG tablet Take 1 tablet (40 mg total) by mouth every evening. Needs labs 30 tablet 0   No current facility-administered medications on file prior to visit.    No Known Allergies    Review of Systems:  Constitutional: No recent illness  HEENT: No  headache, no vision change  Cardiac: No  chest pain, No  pressure, No palpitations  Respiratory:  No  shortness of breath. No  Cough  Gastrointestinal: No  abdominal pain, no change on bowel habits  Musculoskeletal: No new myalgia/arthralgia  Skin: No  Rash  Hem/Onc: No  easy bruising/bleeding, No  abnormal lumps/bumps  Neurologic: No  weakness, No  Dizziness  Psychiatric: No  concerns with depression, No  concerns with anxiety  Exam:  BP (!) 153/77   Pulse 82   Ht 5\' 10"  (1.778 m)   Wt 262 lb (118.8 kg)   BMI 37.59 kg/m   Constitutional: VS see above. General Appearance: alert, well-developed,  well-nourished, NAD  Eyes: Normal lids and conjunctive, non-icteric sclera  Ears, Nose, Mouth, Throat: MMM, Normal external inspection ears/nares/mouth/lips/gums.  Neck: No masses, trachea midline.   Respiratory: Normal respiratory effort. no wheeze, no rhonchi, no rales  Cardiovascular: S1/S2 normal, no murmur, no rub/gallop auscultated. RRR.   Musculoskeletal: Gait normal. Symmetric and independent movement of all extremities  Neurological: Normal balance/coordination. No tremor.  Skin: warm, dry, intact.   Psychiatric: Normal judgment/insight. Normal mood and affect. Oriented x3.      ASSESSMENT/PLAN:   Essential hypertension - Plan: CBC with Differential/Platelet, COMPLETE METABOLIC PANEL WITH GFR, Lipid panel, TSH, lisinopril-hydrochlorothiazide (PRINZIDE,ZESTORETIC) 20-25 MG tablet  Hyperlipidemia, unspecified hyperlipidemia type - Plan: simvastatin (ZOCOR) 40 MG tablet  Anxiety - Plan: PARoxetine (PAXIL) 20 MG tablet, DISCONTINUED: PARoxetine (PAXIL) 20 MG tablet  Prediabetes - Plan: Hemoglobin A1c    Patient Instructions   Let's plan to follow-up with nurse visit for BP check in 2 weeks, if BP ok can refill the Lisinopril-HCT for one year.  Labs today - if cholesterol is looking ok we can refill the Simvastatin for 90 days or we can adjust the dose.   I encourage you to come in for annual physical sometime in the next few months.     Visit summary with medication list and pertinent instructions was printed for patient to review. All questions at time of visit were answered - patient instructed to contact office with any additional concerns. ER/RTC precautions were reviewed with the patient. Follow-up plan: Return in about 1 year (around 10/18/2017) for BLOOD PRESSURE FOLLOWUP .

## 2016-10-18 NOTE — Patient Instructions (Addendum)
   Let's plan to follow-up with nurse visit for BP check in 2 weeks, if BP ok can refill the Lisinopril-HCT for one year.  Labs today - if cholesterol is looking ok we can refill the Simvastatin for 90 days or we can adjust the dose.   I encourage you to come in for annual physical sometime in the next few months.

## 2016-10-19 ENCOUNTER — Other Ambulatory Visit: Payer: Self-pay | Admitting: Osteopathic Medicine

## 2016-10-19 DIAGNOSIS — E785 Hyperlipidemia, unspecified: Secondary | ICD-10-CM

## 2016-10-19 LAB — HEMOGLOBIN A1C
Hgb A1c MFr Bld: 5.8 % — ABNORMAL HIGH (ref <5.7)
Mean Plasma Glucose: 120 mg/dL

## 2016-10-19 MED ORDER — SIMVASTATIN 80 MG PO TABS: 80.0000 mg | ORAL_TABLET | Freq: Every evening | ORAL | 1 refills | Status: DC

## 2016-11-01 ENCOUNTER — Ambulatory Visit (INDEPENDENT_AMBULATORY_CARE_PROVIDER_SITE_OTHER): Payer: Managed Care, Other (non HMO) | Admitting: Osteopathic Medicine

## 2016-11-01 DIAGNOSIS — I10 Essential (primary) hypertension: Secondary | ICD-10-CM

## 2016-11-01 MED ORDER — LISINOPRIL-HYDROCHLOROTHIAZIDE 20-25 MG PO TABS: 1.0000 | ORAL_TABLET | Freq: Every day | ORAL | 3 refills | Status: DC

## 2016-11-01 NOTE — Progress Notes (Signed)
Pt's bp was 123/71 today.  Lisinopril-HCTZ sent to mail order pharmacy for 1 year per pcp's last office note.

## 2016-11-07 ENCOUNTER — Telehealth: Payer: Self-pay

## 2016-11-07 NOTE — Telephone Encounter (Signed)
Called patient left a message on patient vm advising that medication has been sent in to pharmacy on 10/18/16 to [place] advised patient to call the mail order pharmacy first and check on that Rx. Suanne Marker Kaydan Wilhoite,CMA

## 2016-11-07 NOTE — Telephone Encounter (Signed)
Pt came and informed me that one of his medications has not been sent to the pharmacy. It is Paroxetine HCL 20mg  Rx [id number]. Pt stills has a good amount of pills, but needs them refilled. Thanks

## 2016-12-11 ENCOUNTER — Other Ambulatory Visit: Payer: Self-pay | Admitting: Osteopathic Medicine

## 2016-12-11 DIAGNOSIS — E785 Hyperlipidemia, unspecified: Secondary | ICD-10-CM

## 2016-12-11 DIAGNOSIS — I10 Essential (primary) hypertension: Secondary | ICD-10-CM

## 2016-12-11 DIAGNOSIS — F419 Anxiety disorder, unspecified: Secondary | ICD-10-CM

## 2017-05-08 ENCOUNTER — Other Ambulatory Visit: Payer: Self-pay | Admitting: Osteopathic Medicine

## 2017-05-08 DIAGNOSIS — E785 Hyperlipidemia, unspecified: Secondary | ICD-10-CM

## 2017-05-14 ENCOUNTER — Telehealth: Payer: Self-pay | Admitting: Osteopathic Medicine

## 2017-05-14 NOTE — Telephone Encounter (Signed)
Left Message for pt to schedule a f/u on thyroid with Dr.Alexander

## 2017-05-27 ENCOUNTER — Other Ambulatory Visit: Payer: Self-pay | Admitting: Osteopathic Medicine

## 2017-05-27 DIAGNOSIS — E785 Hyperlipidemia, unspecified: Secondary | ICD-10-CM

## 2017-06-19 ENCOUNTER — Other Ambulatory Visit: Payer: Self-pay | Admitting: *Deleted

## 2017-08-02 ENCOUNTER — Ambulatory Visit: Payer: Managed Care, Other (non HMO) | Admitting: Osteopathic Medicine

## 2017-08-02 ENCOUNTER — Telehealth: Payer: Self-pay

## 2017-08-02 DIAGNOSIS — E785 Hyperlipidemia, unspecified: Secondary | ICD-10-CM

## 2017-08-02 MED ORDER — SIMVASTATIN 80 MG PO TABS
80.0000 mg | ORAL_TABLET | Freq: Every day | ORAL | 0 refills | Status: DC
Start: 2017-08-02 — End: 2017-08-09

## 2017-08-02 MED ORDER — SIMVASTATIN 80 MG PO TABS
80.0000 mg | ORAL_TABLET | Freq: Every day | ORAL | 0 refills | Status: DC
Start: 2017-08-02 — End: 2017-08-19

## 2017-08-07 ENCOUNTER — Other Ambulatory Visit: Payer: Self-pay

## 2017-08-07 NOTE — Telephone Encounter (Signed)
Refill request sent to pharmacy. John Williamsen,CMA

## 2017-08-07 NOTE — Telephone Encounter (Signed)
Opened in error. Rhonda Cunningham,CMA  

## 2017-08-09 ENCOUNTER — Other Ambulatory Visit: Payer: Self-pay | Admitting: Osteopathic Medicine

## 2017-08-09 NOTE — Progress Notes (Signed)
Duplicate dc

## 2017-08-19 ENCOUNTER — Other Ambulatory Visit: Payer: Self-pay

## 2017-08-19 DIAGNOSIS — E785 Hyperlipidemia, unspecified: Secondary | ICD-10-CM

## 2017-08-19 MED ORDER — SIMVASTATIN 80 MG PO TABS: 80.0000 mg | ORAL_TABLET | Freq: Every day | ORAL | 2 refills | Status: DC

## 2017-08-24 ENCOUNTER — Emergency Department (INDEPENDENT_AMBULATORY_CARE_PROVIDER_SITE_OTHER): Payer: Managed Care, Other (non HMO)

## 2017-08-24 ENCOUNTER — Emergency Department (INDEPENDENT_AMBULATORY_CARE_PROVIDER_SITE_OTHER)
Admission: EM | Admit: 2017-08-24 | Discharge: 2017-08-24 | Disposition: A | Discharge status: Home / Self Care | Payer: Managed Care, Other (non HMO) | Source: Home / Self Care | Attending: Family Medicine | Admitting: Family Medicine

## 2017-08-24 ENCOUNTER — Encounter: Payer: Self-pay | Admitting: Emergency Medicine

## 2017-08-24 DIAGNOSIS — R0781 Pleurodynia: Secondary | ICD-10-CM

## 2017-08-24 DIAGNOSIS — R0789 Other chest pain: Secondary | ICD-10-CM

## 2017-08-24 MED ORDER — CYCLOBENZAPRINE HCL 10 MG PO TABS: 10.0000 mg | ORAL_TABLET | Freq: Two times a day (BID) | ORAL | 0 refills | Status: DC | PRN

## 2017-08-24 NOTE — Discharge Instructions (Signed)
°  You may take 500mg  acetaminophen every 4-6 hours or in combination with ibuprofen 400-600mg  every 6-8 hours as needed for pain.  Flexeril is a muscle relaxer and may cause drowsiness. Do not drink alcohol, drive, or operate heavy machinery while taking.

## 2017-08-24 NOTE — ED Provider Notes (Signed)
[facility]    CSN: [id number] Arrival date & time: 08/24/17  1150     History   Chief Complaint Chief Complaint  Patient presents with  . Chest Pain    HPI Nathaniel Montes is a 61 y.o. male.   HPI Nathaniel Montes is a 61 y.o. male presenting to UC with c/o Right side anterior rib pain that started 3 days ago after working on a Astronomer.  He was trying to move it while servicing the machine and felt a "pop" just under Right nipple.  Pain is aching and sore but this morning pain is worse, sharp at times with deep breathing or cough.  Pain is 5/10 at this time. He has not tried anything for the pain, no ice or OTC pain medications. Denies congestion, fever, or SOB. No bruising or rash or area. No prior rib fracture.    Past Medical History:  Diagnosis Date  . Anxiety 11/17/2012  . HYPERLIPIDEMIA 11/29/2009  . HYPERTENSION 11/29/2009  . Psoriasis 11/17/2012    Patient Active Problem List   Diagnosis Date Noted  . Osteoarthritis of both hands 07/22/2014  . History of adenomatous polyp of colon 01/21/2013  . Prediabetes 12/19/2012  . Anxiety 11/17/2012  . Psoriasis 11/17/2012  . Hyperlipidemia LDL goal < 130 FramRisk 11% Dec 2013 11/29/2009  . Essential hypertension 11/29/2009    History reviewed. No pertinent surgical history.     Home Medications    Prior to Admission medications   Medication Sig Start Date End Date Taking? Authorizing Provider  cyclobenzaprine (FLEXERIL) 10 MG tablet Take 1 tablet (10 mg total) by mouth 2 (two) times daily as needed. 08/24/17   Noe Gens, PA-C  etanercept (ENBREL) 50 MG/ML injection Inject 50 mg into the skin once a week.    [provider]  lisinopril-hydrochlorothiazide (PRINZIDE,ZESTORETIC) 20-25 MG tablet Take 1 tablet by mouth daily. 11/01/16   Emeterio Reeve, DO  PARoxetine (PAXIL) 20 MG tablet Take 1 tablet (20 mg total) by mouth every morning. 10/18/16   Emeterio Reeve, DO  simvastatin  (ZOCOR) 80 MG tablet Take 1 tablet (80 mg total) by mouth daily at 6 PM. 08/19/17   Emeterio Reeve, DO    Family History Family History  Problem Relation Age of Onset  . Hypertension Father     Social History Social History  Substance Use Topics  . Smoking status: Former Smoker    Types: Cigarettes  . Smokeless tobacco: Never Used  . Alcohol use Yes     Allergies   Patient has no known allergies.   Review of Systems Review of Systems  Constitutional: Negative for chills and fever.  Respiratory: Negative for cough, chest tightness and shortness of breath.   Cardiovascular: Positive for chest pain (Right anterior ribs). Negative for palpitations.  Skin: Negative for color change, rash and wound.     Physical Exam Triage Vital Signs ED Triage Vitals [08/24/17 1212]  Enc Vitals Group     BP (!) 144/78     Pulse Rate 80     Resp      Temp 98.7 F (37.1 C)     Temp Source Oral     SpO2 93 %     Weight 261 lb 8 oz (118.6 kg)     Height 5\' 10"  (1.778 m)     Head Circumference      Peak Flow      Pain Score 5     Pain Loc  Pain Edu?      Excl. in [place]?    No data found.   Updated Vital Signs BP (!) 144/78 (BP Location: Left Arm)   Pulse 80   Temp 98.7 F (37.1 C) (Oral)   Ht 5\' 10"  (1.778 m)   Wt 261 lb 8 oz (118.6 kg)   SpO2 93%   BMI 37.52 kg/m   Visual Acuity Right Eye Distance:   Left Eye Distance:   Bilateral Distance:    Right Eye Near:   Left Eye Near:    Bilateral Near:     Physical Exam  Constitutional: He is oriented to person, place, and time. He appears well-developed and well-nourished. No distress.  HENT:  Head: Normocephalic and atraumatic.  Eyes: EOM are normal.  Neck: Normal range of motion.  Cardiovascular: Normal rate and regular rhythm.   Pulmonary/Chest: Effort normal and breath sounds normal. No respiratory distress. He has no wheezes. He has no rales. He exhibits tenderness.    Musculoskeletal: Normal range of  motion.  Neurological: He is alert and oriented to person, place, and time.  Skin: Skin is warm and dry. He is not diaphoretic.  Psychiatric: He has a normal mood and affect. His behavior is normal.  Nursing note and vitals reviewed.    UC Treatments / Results  Labs (all labs ordered are listed, but only abnormal results are displayed) Labs Reviewed - No data to display  EKG  EKG Interpretation None       Radiology Dg Ribs Unilateral W/chest Right  Result Date: 08/24/2017 CLINICAL DATA:  Pain after trauma. EXAM: RIGHT RIBS AND CHEST - 3+ VIEW COMPARISON:  May 03, 2007 FINDINGS: The heart, hila, mediastinum, lungs, and pleura are normal. No pneumothorax. No rib fractures are identified. IMPRESSION: No rib fractures identified.  Normal chest. Electronically Signed   By: Dorise Bullion III M.D   On: 08/24/2017 12:44    Procedures Procedures (including critical care time)  Medications Ordered in UC Medications - No data to display   Initial Impression / Assessment and Plan / UC Course  I have reviewed the triage vital signs and the nursing notes.  Pertinent labs & imaging results that were available during my care of the patient were reviewed by me and considered in my medical decision making (see chart for details).    Pt c/o Right side rib pain. No specific known injury but did feel a "pop" while working on a heavy dishwasher 3 days ago.  Plain films: no acute findings Reassured pt  Encouraged cool compresses Acetaminophen and ibuprofen May take flexeril at night as needed for pain   Final Clinical Impressions(s) / UC Diagnoses   Final diagnoses:  Right-sided chest wall pain    New Prescriptions Discharge Medication List as of 08/24/2017 12:58 PM    START taking these medications   Details  cyclobenzaprine (FLEXERIL) 10 MG tablet Take 1 tablet (10 mg total) by mouth 2 (two) times daily as needed., Starting Sat 08/24/2017, Normal         Controlled  Substance Prescriptions [place] Controlled Substance Registry consulted? Not Applicable   Tyrell Antonio 08/24/17 1352

## 2017-08-24 NOTE — ED Triage Notes (Signed)
Possible cracked rib on right side while working working on a machine on Wednesday pain worsen last night and today, hurts to take a deep breath.

## 2017-10-16 ENCOUNTER — Other Ambulatory Visit: Payer: Self-pay | Admitting: Osteopathic Medicine

## 2017-10-16 DIAGNOSIS — F419 Anxiety disorder, unspecified: Secondary | ICD-10-CM

## 2017-10-16 DIAGNOSIS — I10 Essential (primary) hypertension: Secondary | ICD-10-CM

## 2017-10-17 ENCOUNTER — Ambulatory Visit (INDEPENDENT_AMBULATORY_CARE_PROVIDER_SITE_OTHER): Payer: Managed Care, Other (non HMO) | Admitting: Osteopathic Medicine

## 2017-10-17 ENCOUNTER — Encounter: Payer: Self-pay | Admitting: Osteopathic Medicine

## 2017-10-17 VITALS — BP 128/79 | HR 76 | Wt 262.0 lb

## 2017-10-17 DIAGNOSIS — E782 Mixed hyperlipidemia: Secondary | ICD-10-CM

## 2017-10-17 DIAGNOSIS — F419 Anxiety disorder, unspecified: Secondary | ICD-10-CM | POA: Diagnosis not present

## 2017-10-17 DIAGNOSIS — Z860100 Personal history of colon polyps, unspecified: Secondary | ICD-10-CM

## 2017-10-17 DIAGNOSIS — Z0000 Encounter for general adult medical examination without abnormal findings: Secondary | ICD-10-CM | POA: Diagnosis not present

## 2017-10-17 DIAGNOSIS — Z1211 Encounter for screening for malignant neoplasm of colon: Secondary | ICD-10-CM

## 2017-10-17 DIAGNOSIS — E785 Hyperlipidemia, unspecified: Secondary | ICD-10-CM

## 2017-10-17 DIAGNOSIS — I10 Essential (primary) hypertension: Secondary | ICD-10-CM | POA: Diagnosis not present

## 2017-10-17 DIAGNOSIS — Z23 Encounter for immunization: Secondary | ICD-10-CM | POA: Diagnosis not present

## 2017-10-17 DIAGNOSIS — R7303 Prediabetes: Secondary | ICD-10-CM

## 2017-10-17 DIAGNOSIS — Z125 Encounter for screening for malignant neoplasm of prostate: Secondary | ICD-10-CM | POA: Diagnosis not present

## 2017-10-17 MED ORDER — SIMVASTATIN 80 MG PO TABS: 80.0000 mg | ORAL_TABLET | Freq: Every day | ORAL | 3 refills | Status: DC

## 2017-10-17 MED ORDER — ZOSTER VAC RECOMB ADJUVANTED 50 MCG/0.5ML IM SUSR: 0.5000 mL | Freq: Once | INTRAMUSCULAR | 1 refills | Status: AC

## 2017-10-17 NOTE — Progress Notes (Signed)
HPI: Nathaniel Montes is a 61 y.o. male  who presents to [facility] today, 10/17/17,  for chief complaint of:  Chief Complaint  Patient presents with  . Annual Exam    Patient here for annual physical / wellness exam.  See preventive care reviewed as below.  Recent labs reviewed as available, A1C overdue.    Additional concerns today include:   Hypertension: doing well, needs refill  Hyperlipidemia: no concerns, needs 90 days refill  Anxiety: Requests refill of Paxil, doing well on this medication for several years  Prediabetes: Due for A1c recheck. No polyuria/polydipsia.    Past medical, surgical, social and family history reviewed: Patient Active Problem List   Diagnosis Date Noted  . Osteoarthritis of both hands 07/22/2014  . History of adenomatous polyp of colon 01/21/2013  . Prediabetes 12/19/2012  . Anxiety 11/17/2012  . Psoriasis 11/17/2012  . Hyperlipidemia LDL goal < 130 FramRisk 11% Dec 2013 11/29/2009  . Essential hypertension 11/29/2009   No past surgical history on file. Social History   Tobacco Use  . Smoking status: Former Smoker    Types: Cigarettes  . Smokeless tobacco: Never Used  Substance Use Topics  . Alcohol use: Yes   Family History  Problem Relation Age of Onset  . Hypertension Father      Current medication list and allergy/intolerance information reviewed:   Current Outpatient Medications on File Prior to Visit  Medication Sig Dispense Refill  . cyclobenzaprine (FLEXERIL) 10 MG tablet Take 1 tablet (10 mg total) by mouth 2 (two) times daily as needed. 20 tablet 0  . etanercept (ENBREL) 50 MG/ML injection Inject 50 mg into the skin once a week.    Marland Kitchen lisinopril-hydrochlorothiazide (PRINZIDE,ZESTORETIC) 20-25 MG tablet TAKE 1 TABLET DAILY 90 tablet 3  . PARoxetine (PAXIL) 20 MG tablet TAKE 1 TABLET EVERY MORNING 90 tablet 3  . simvastatin (ZOCOR) 80 MG tablet Take 1 tablet (80 mg total) by mouth daily  at 6 PM. 30 tablet 2   No current facility-administered medications on file prior to visit.    No Known Allergies    Review of Systems:  Constitutional: No recent illness  HEENT: No  headache, no vision change  Cardiac: No  chest pain, No  pressure, No palpitations  Respiratory:  No  shortness of breath. No  Cough  Gastrointestinal: No  abdominal pain, no change on bowel habits  Musculoskeletal: No new myalgia/arthralgia  Skin: No  Rash  Hem/Onc: No  easy bruising/bleeding, No  abnormal lumps/bumps  Neurologic: No  weakness, No  Dizziness  Psychiatric: No  concerns with depression, No  concerns with anxiety  Exam:  BP 128/79   Pulse 76   Wt 262 lb (118.8 kg)   BMI 37.59 kg/m   Constitutional: VS see above. General Appearance: alert, well-developed, well-nourished, NAD  Eyes: Normal lids and conjunctive, non-icteric sclera  Ears, Nose, Mouth, Throat: MMM, Normal external inspection ears/nares/mouth/lips/gums.  Neck: No masses, trachea midline.   Respiratory: Normal respiratory effort. no wheeze, no rhonchi, no rales  Cardiovascular: S1/S2 normal, no murmur, no rub/gallop auscultated. RRR.   Musculoskeletal: Gait normal. Symmetric and independent movement of all extremities  Neurological: Normal balance/coordination. No tremor.  Skin: warm, dry, intact.   Psychiatric: Normal judgment/insight. Normal mood and affect. Oriented x3.      ASSESSMENT/PLAN:   Annual physical exam  Essential hypertension - Plan: CBC, COMPLETE METABOLIC PANEL WITH GFR, Lipid panel  Prediabetes - Plan: Hemoglobin A1c  Mixed hyperlipidemia - Plan: Lipid panel  Anxiety  History of colon polyps - Plan: Ambulatory referral to Gastroenterology  Need for shingles vaccine - Plan: Zoster Vaccine Adjuvanted [facility]) injection  Colon cancer screening - Plan: Ambulatory referral to Gastroenterology  Prostate cancer screening - Plan: PSA  Hyperlipidemia, unspecified  hyperlipidemia type - Plan: simvastatin (ZOCOR) 80 MG tablet     MALE PREVENTIVE CARE  updated 10/17/17  ANNUAL SCREENING/COUNSELING  Any changes to health in the past year? no  Diet/Exercise - HEALTHY HABITS DISCUSSED TO DECREASE CV RISK Social History   Tobacco Use  Smoking Status Former Smoker  . Types: Cigarettes - quit >20 years ago  Smokeless Tobacco Never Used   Social History   Substance and Sexual Activity  Alcohol Use Yes   Depression screen [facility] 2/9 10/17/2017  Decreased Interest 0  Down, Depressed, Hopeless 0  PHQ - 2 Score 0  Altered sleeping -  Tired, decreased energy -  Change in appetite -  Feeling bad or failure about yourself  -  Trouble concentrating -  Moving slowly or fidgety/restless -  Suicidal thoughts -  PHQ-9 Score -    SEXUAL/REPRODUCTIVE HEALTH  STI testing needed/desired today? - no  Any concerns with testosterone/libido? - no  INFECTIOUS DISEASE SCREENING  HIV - needs - declined  GC/CT - does not need  HepC - needs - declined  TB - does not need  CANCER SCREENING  Lung - USPSTF: 55-80yo w/ 30 py hx unless quit w/in 73yr - does not need, quit >15 years ago  Colon - needs - overdue   Prostate - needs  OTHER DISEASE SCREENING  Lipid - needs  DM2 - needs  AAA - 65-75yo ever smoked: does not need yet  Osteoporosis - men 63yo+ - does not need  ADULT VACCINATION  Influenza - annual vaccine recommended - got this today   Td - booster every 10 years - due now  Zoster - Shingrix recommended 34+ years old - Rx printed  PCV13 - was not indicated  PPSV23 - was not indicated Immunization History  Administered Date(s) Administered  . Influenza,inj,Quad PF,6+ Mos 10/17/2017  . Td 02/04/2007  . Tdap 10/17/2017        Visit summary with medication list and pertinent instructions was printed for patient to review. All questions at time of visit were answered - patient instructed to contact office with any  additional concerns. ER/RTC precautions were reviewed with the patient. Follow-up plan: Return for recheck depending on labs, 6 months for followup on sugars and 1 year for annual .

## 2017-10-18 LAB — COMPLETE METABOLIC PANEL WITH GFR
AG Ratio: 2 (calc) (ref 1.0–2.5)
ALT: 19 U/L (ref 9–46)
AST: 17 U/L (ref 10–35)
Albumin: 4.3 g/dL (ref 3.6–5.1)
Alkaline phosphatase (APISO): 62 U/L (ref 40–115)
BUN: 15 mg/dL (ref 7–25)
CHLORIDE: 103 mmol/L (ref 98–110)
CO2: 26 mmol/L (ref 20–32)
Calcium: 9.2 mg/dL (ref 8.6–10.3)
Creat: 1.09 mg/dL (ref 0.70–1.25)
GFR, Est African American: 84 mL/min/{1.73_m2} (ref >=60)
GFR, Est Non African American: 72 mL/min/{1.73_m2} (ref >=60)
GLOBULIN: 2.2 g/dL (ref 1.9–3.7)
Glucose, Bld: 118 mg/dL — ABNORMAL HIGH (ref 65–99)
POTASSIUM: 4.6 mmol/L (ref 3.5–5.3)
Sodium: 136 mmol/L (ref 135–146)
Total Bilirubin: 0.4 mg/dL (ref 0.2–1.2)
Total Protein: 6.5 g/dL (ref 6.1–8.1)

## 2017-10-18 LAB — CBC
HEMATOCRIT: 41.3 % (ref 38.5–50.0)
Hemoglobin: 14.5 g/dL (ref 13.2–17.1)
MCH: 33.7 pg — AB (ref 27.0–33.0)
MCHC: 35.1 g/dL (ref 32.0–36.0)
MCV: 96 fL (ref 80.0–100.0)
MPV: 10.7 fL (ref 7.5–12.5)
Platelets: 270 10*3/uL (ref 140–400)
RBC: 4.3 10*6/uL (ref 4.20–5.80)
RDW: 12.4 % (ref 11.0–15.0)
WBC: 5.7 10*3/uL (ref 3.8–10.8)

## 2017-10-18 LAB — HEMOGLOBIN A1C
EAG (MMOL/L): 6.6 (calc)
Hgb A1c MFr Bld: 5.8 % of total Hgb — ABNORMAL HIGH (ref <5.7)
MEAN PLASMA GLUCOSE: 120 (calc)

## 2017-10-18 LAB — LIPID PANEL
Cholesterol: 130 mg/dL (ref <200)
HDL: 39 mg/dL — AB (ref >40)
LDL Cholesterol (Calc): 74 mg/dL (calc)
Non-HDL Cholesterol (Calc): 91 mg/dL (calc) (ref <130)
TRIGLYCERIDES: 88 mg/dL (ref <150)
Total CHOL/HDL Ratio: 3.3 (calc) (ref <5.0)

## 2017-11-27 ENCOUNTER — Encounter: Payer: Self-pay | Admitting: Osteopathic Medicine

## 2018-04-17 ENCOUNTER — Encounter: Payer: Self-pay | Admitting: Osteopathic Medicine

## 2018-04-17 ENCOUNTER — Ambulatory Visit (INDEPENDENT_AMBULATORY_CARE_PROVIDER_SITE_OTHER): Payer: Managed Care, Other (non HMO)

## 2018-04-17 ENCOUNTER — Ambulatory Visit (INDEPENDENT_AMBULATORY_CARE_PROVIDER_SITE_OTHER): Payer: Managed Care, Other (non HMO) | Admitting: Osteopathic Medicine

## 2018-04-17 VITALS — BP 132/67 | HR 62 | Temp 98.2°F | Wt 268.1 lb

## 2018-04-17 DIAGNOSIS — L409 Psoriasis, unspecified: Secondary | ICD-10-CM

## 2018-04-17 DIAGNOSIS — F419 Anxiety disorder, unspecified: Secondary | ICD-10-CM | POA: Diagnosis not present

## 2018-04-17 DIAGNOSIS — M1711 Unilateral primary osteoarthritis, right knee: Secondary | ICD-10-CM | POA: Diagnosis not present

## 2018-04-17 DIAGNOSIS — R7303 Prediabetes: Secondary | ICD-10-CM | POA: Diagnosis not present

## 2018-04-17 DIAGNOSIS — I10 Essential (primary) hypertension: Secondary | ICD-10-CM

## 2018-04-17 LAB — POCT GLYCOSYLATED HEMOGLOBIN (HGB A1C): HEMOGLOBIN A1C: 5.9

## 2018-04-17 MED ORDER — DICLOFENAC SODIUM 1 % TD GEL: 2.0000 g | Freq: Four times a day (QID) | TRANSDERMAL | 11 refills | Status: DC

## 2018-04-17 NOTE — Progress Notes (Signed)
HPI: Nathaniel Montes is a 62 y.o. male who  has a past medical history of Anxiety (11/17/2012), HYPERLIPIDEMIA (11/29/2009), HYPERTENSION (11/29/2009), and Psoriasis (11/17/2012).  he presents to [facility] today, 04/17/18,  for chief complaint of:  Recheck A1C   Prediabetes: A1C today 5.9% HTN: BP at goal today, no concerns  HLD: no concerns Anxiety: doing well on Paxil  Psoriasis: continuing Enbrel, doing well   Knee pain: ongoing for few years, worse lately, typically follows with orthopedics but wanted to just mention this today, see if he might be able to get an x-ray. He is wearing a brace.     Past medical history, surgical history, and family history reviewed.  Current medication list and allergy/intolerance information reviewed.   (See remainder of HPI, ROS, Phys Exam below)  Results for orders placed or performed in visit on 04/17/18 (from the past 72 hour(s))  POCT HgB A1C     Status: None   Collection Time: 04/17/18  8:47 AM  Result Value Ref Range   Hemoglobin A1C 5.9    Dg Knee Complete 4 Views Right  Result Date: 04/17/2018 CLINICAL DATA:  Pain EXAM: RIGHT KNEE - COMPLETE 4+ VIEW COMPARISON:  November 29, 2009 FINDINGS: Frontal, lateral, and bilateral oblique views were obtained. There is no evident fracture or dislocation. No appreciable joint effusion. There is moderately severe narrowing medially, progressed from prior study. There is spurring medially. Other joint spaces appear unremarkable. No erosive change. IMPRESSION: Moderately severe narrowing medially with progression from prior study. Other joint spaces appear unremarkable. No evident fracture or joint effusion. Electronically Signed   By: Lowella Grip III M.D.   On: 04/17/2018 12:51      ASSESSMENT/PLAN: chronic medical issues stable, advised follow-up with sports med or his orthopedist for the knee issue.  Prediabetes - Plan: POCT HgB A1C  Essential  hypertension  Anxiety  Psoriasis  Arthritis of right knee - Plan: DG Knee Complete 4 Views Right   Meds ordered this encounter  Medications  . diclofenac sodium (VOLTAREN) 1 % GEL    Sig: Apply 2-4 g topically 4 (four) times daily.    Dispense:  100 g    Refill:  11     Follow-up plan: Return in about 6 months (around 10/18/2018) for annual physical w/ Dr A, sooner w/ sports med if needed for knee .     ############################################ ############################################ ############################################ ############################################    Outpatient Encounter Medications as of 04/17/2018  Medication Sig  . cyclobenzaprine (FLEXERIL) 10 MG tablet Take 1 tablet (10 mg total) by mouth 2 (two) times daily as needed.  . etanercept (ENBREL) 50 MG/ML injection Inject 50 mg into the skin once a week.  Marland Kitchen lisinopril-hydrochlorothiazide (PRINZIDE,ZESTORETIC) 20-25 MG tablet TAKE 1 TABLET DAILY  . PARoxetine (PAXIL) 20 MG tablet TAKE 1 TABLET EVERY MORNING  . simvastatin (ZOCOR) 80 MG tablet Take 1 tablet (80 mg total) daily at 6 PM by mouth.   No facility-administered encounter medications on file as of 04/17/2018.    No Known Allergies    Review of Systems:  Constitutional: No recent illness  HEENT: No  headache, no vision change  Cardiac: No  chest pain, No  pressure, No palpitations  Respiratory:  No  shortness of breath. No  Cough  Gastrointestinal: No  abdominal pain, no change on bowel habits  Musculoskeletal: +new myalgia/arthralgia  Skin: No  Rash  Hem/Onc: No  easy bruising/bleeding, No  abnormal lumps/bumps  Neurologic:  No  weakness, No  Dizziness  Psychiatric: No  concerns with depression, No  concerns with anxiety  Depression screen [facility] 2/9 04/17/2018 10/17/2017 10/17/2017  Decreased Interest 0 0 0  Down, Depressed, Hopeless 0 0 0  PHQ - 2 Score 0 0 0  Altered sleeping 0 - -  Tired, decreased energy 0 - -   Change in appetite 0 - -  Feeling bad or failure about yourself  0 - -  Trouble concentrating 0 - -  Moving slowly or fidgety/restless 0 - -  Suicidal thoughts 0 - -  PHQ-9 Score 0 - -  Difficult doing work/chores Not difficult at all - -   GAD 7 : Generalized Anxiety Score 04/17/2018 10/18/2016  Nervous, Anxious, on Edge 0 0  Control/stop worrying 0 0  Worry too much - different things 0 0  Trouble relaxing 0 0  Restless 0 0  Easily annoyed or irritable 0 0  Afraid - awful might happen 0 0  Total GAD 7 Score 0 0  Anxiety Difficulty Not difficult at all Not difficult at all      Exam:  BP 132/67 (BP Location: Left Arm, Patient Position: Sitting, Cuff Size: Large)   Pulse 62   Temp 98.2 F (36.8 C) (Oral)   Wt 268 lb 1.6 oz (121.6 kg)   BMI 38.47 kg/m   Constitutional: VS see above. General Appearance: alert, well-developed, well-nourished, NAD  Eyes: Normal lids and conjunctive, non-icteric sclera  Ears, Nose, Mouth, Throat: MMM, Normal external inspection ears/nares/mouth/lips/gums.  Neck: No masses, trachea midline.   Respiratory: Normal respiratory effort. no wheeze, no rhonchi, no rales  Cardiovascular: S1/S2 normal, no murmur, no rub/gallop auscultated. RRR.   Musculoskeletal: Gait normal. Symmetric and independent movement of all extremities. Removed knee brace. Examination. Crepitus to right knee, no meniscal signs. No effusion.Ligamentous exam shows intact anterior cruciate ligament, PCL, collateral ligaments.  Neurological: Normal balance/coordination. No tremor.  Skin: warm, dry, intact.   Psychiatric: Normal judgment/insight. Normal mood and affect. Oriented x3.   Visit summary with medication list and pertinent instructions was printed for patient to review, advised to alert [place] if any changes needed. All questions at time of visit were answered - patient instructed to contact office with any additional concerns. ER/RTC precautions were reviewed with the  patient and understanding verbalized.   Follow-up plan: Return in about 6 months (around 10/18/2018) for annual physical w/ Dr A, sooner w/ sports med if needed for knee .  Note: Total time spent 25 minutes, greater than 50% of the visit was spent face-to-face counseling and coordinating care for the following: The primary encounter diagnosis was Prediabetes. Diagnoses of Essential hypertension, Anxiety, Psoriasis, and Arthritis of right knee were also pertinent to this visit.Marland Kitchen  Please note: voice recognition software was used to produce this document, and typos may escape review. Please contact Dr. Sheppard Coil for any needed clarifications.

## 2018-04-18 ENCOUNTER — Encounter: Payer: Self-pay | Admitting: Osteopathic Medicine

## 2018-04-18 DIAGNOSIS — M1712 Unilateral primary osteoarthritis, left knee: Secondary | ICD-10-CM | POA: Insufficient documentation

## 2018-04-18 DIAGNOSIS — M1711 Unilateral primary osteoarthritis, right knee: Secondary | ICD-10-CM | POA: Insufficient documentation

## 2018-10-03 ENCOUNTER — Other Ambulatory Visit: Payer: Self-pay | Admitting: Osteopathic Medicine

## 2018-10-03 DIAGNOSIS — I10 Essential (primary) hypertension: Secondary | ICD-10-CM

## 2018-10-03 DIAGNOSIS — F419 Anxiety disorder, unspecified: Secondary | ICD-10-CM

## 2018-10-09 ENCOUNTER — Other Ambulatory Visit: Payer: Self-pay | Admitting: Osteopathic Medicine

## 2018-10-09 DIAGNOSIS — E785 Hyperlipidemia, unspecified: Secondary | ICD-10-CM

## 2018-10-17 ENCOUNTER — Encounter: Payer: Managed Care, Other (non HMO) | Admitting: Osteopathic Medicine

## 2018-10-20 ENCOUNTER — Encounter: Payer: Managed Care, Other (non HMO) | Admitting: Osteopathic Medicine

## 2018-11-05 ENCOUNTER — Ambulatory Visit: Payer: Managed Care, Other (non HMO) | Admitting: Osteopathic Medicine

## 2018-11-05 ENCOUNTER — Other Ambulatory Visit: Payer: Self-pay | Admitting: Osteopathic Medicine

## 2018-11-05 DIAGNOSIS — E785 Hyperlipidemia, unspecified: Secondary | ICD-10-CM

## 2018-11-05 NOTE — Telephone Encounter (Signed)
Please review for refill- patient at [facility]  (Patient has appointment today)

## 2018-12-03 HISTORY — PX: REPLACEMENT TOTAL KNEE: SUR1224

## 2018-12-04 ENCOUNTER — Ambulatory Visit (INDEPENDENT_AMBULATORY_CARE_PROVIDER_SITE_OTHER): Payer: Managed Care, Other (non HMO) | Admitting: Sports Medicine

## 2018-12-04 ENCOUNTER — Ambulatory Visit (INDEPENDENT_AMBULATORY_CARE_PROVIDER_SITE_OTHER): Payer: Managed Care, Other (non HMO) | Admitting: Osteopathic Medicine

## 2018-12-04 ENCOUNTER — Encounter: Payer: Self-pay | Admitting: Osteopathic Medicine

## 2018-12-04 ENCOUNTER — Encounter: Payer: Self-pay | Admitting: Sports Medicine

## 2018-12-04 VITALS — BP 130/67 | HR 73 | Temp 98.1°F | Wt 271.3 lb

## 2018-12-04 DIAGNOSIS — F419 Anxiety disorder, unspecified: Secondary | ICD-10-CM | POA: Diagnosis not present

## 2018-12-04 DIAGNOSIS — R7303 Prediabetes: Secondary | ICD-10-CM

## 2018-12-04 DIAGNOSIS — I10 Essential (primary) hypertension: Secondary | ICD-10-CM | POA: Diagnosis not present

## 2018-12-04 DIAGNOSIS — M1711 Unilateral primary osteoarthritis, right knee: Secondary | ICD-10-CM

## 2018-12-04 DIAGNOSIS — Z23 Encounter for immunization: Secondary | ICD-10-CM | POA: Diagnosis not present

## 2018-12-04 DIAGNOSIS — E785 Hyperlipidemia, unspecified: Secondary | ICD-10-CM

## 2018-12-04 LAB — POCT GLYCOSYLATED HEMOGLOBIN (HGB A1C): HEMOGLOBIN A1C: 6 % — AB (ref 4.0–5.6)

## 2018-12-04 MED ORDER — PAROXETINE HCL 20 MG PO TABS: 20.0000 mg | ORAL_TABLET | Freq: Every morning | ORAL | 3 refills | Status: DC

## 2018-12-04 MED ORDER — MELOXICAM 15 MG PO TABS: ORAL_TABLET | ORAL | 3 refills | Status: DC

## 2018-12-04 MED ORDER — SIMVASTATIN 80 MG PO TABS: 80.0000 mg | ORAL_TABLET | Freq: Every day | ORAL | 3 refills | Status: DC

## 2018-12-04 MED ORDER — LISINOPRIL-HYDROCHLOROTHIAZIDE 20-25 MG PO TABS: 1.0000 | ORAL_TABLET | Freq: Every day | ORAL | 3 refills | Status: DC

## 2018-12-04 NOTE — Assessment & Plan Note (Signed)
Moderate osteoarthritis of the right knee. He did have a steroid injection at an outside facility, he tells me he had no relief, not even temporary which makes me think the injection ended up periarticular rather than intra-articular. That being said he is not interested in Visco supplementation, genicular RFA, he is agreeable to proceed with arthroplasty. Adding meloxicam, he will continue his bracing, referral to Dr. Berenice Primas.

## 2018-12-04 NOTE — Progress Notes (Signed)
HPI: Nathaniel Montes is a 63 y.o. male who  has a past medical history of Anxiety (11/17/2012), HYPERLIPIDEMIA (11/29/2009), HYPERTENSION (11/29/2009), and Psoriasis (11/17/2012).  he presents to [facility] today, 12/04/18,  for chief complaint of:  Recheck prediabetes, BP  Doing well on current medications Has been out of statin for a month or so No CP/SOB, no HA/VC, no dizziness      At today's visit... Past medical history, surgical history, and family history reviewed and updated as needed.  Current medication list and allergy/intolerance information reviewed and updated as needed. (See remainder of HPI, ROS, Phys Exam below)   No results found.  Results for orders placed or performed in visit on 12/04/18 (from the past 72 hour(s))  POCT HgB A1C     Status: Abnormal   Collection Time: 12/04/18  8:11 AM  Result Value Ref Range   Hemoglobin A1C 6.0 (A) 4.0 - 5.6 %   HbA1c POC (<> result, manual entry)     HbA1c, POC (prediabetic range)     HbA1c, POC (controlled diabetic range)            ASSESSMENT/PLAN: The primary encounter diagnosis was Prediabetes. Diagnoses of Need for influenza vaccination, Essential hypertension, Anxiety, and Hyperlipidemia, unspecified hyperlipidemia type were also pertinent to this visit.  Orders Placed This Encounter  Procedures  . Flu Vaccine QUAD 6+ mos PF IM (Fluarix Quad PF)  . CBC  . COMPLETE METABOLIC PANEL WITH GFR  . Lipid panel  . POCT HgB A1C     Meds ordered this encounter  Medications  . lisinopril-hydrochlorothiazide (PRINZIDE,ZESTORETIC) 20-25 MG tablet    Sig: Take 1 tablet by mouth daily.    Dispense:  90 tablet    Refill:  3  . PARoxetine (PAXIL) 20 MG tablet    Sig: Take 1 tablet (20 mg total) by mouth every morning.    Dispense:  90 tablet    Refill:  3  . simvastatin (ZOCOR) 80 MG tablet    Sig: Take 1 tablet (80 mg total) by mouth daily.    Dispense:  90 tablet   Refill:  3    Patient Instructions  Plan: Labs in 6 weeks or so to check on cholesterol  See me in a few months for annual check-up!        Follow-up plan: Return in about 4 months (around 04/04/2019) for annual physical / wellness check-up .                             ############################################ ############################################ ############################################ ############################################    Current Meds  Medication Sig  . etanercept (ENBREL) 50 MG/ML injection Inject 50 mg into the skin once a week.  Marland Kitchen lisinopril-hydrochlorothiazide (PRINZIDE,ZESTORETIC) 20-25 MG tablet Take 1 tablet by mouth daily.  Marland Kitchen PARoxetine (PAXIL) 20 MG tablet Take 1 tablet (20 mg total) by mouth every morning.  . simvastatin (ZOCOR) 80 MG tablet Take 1 tablet (80 mg total) by mouth daily.  . [DISCONTINUED] lisinopril-hydrochlorothiazide (PRINZIDE,ZESTORETIC) 20-25 MG tablet TAKE 1 TABLET DAILY  . [DISCONTINUED] PARoxetine (PAXIL) 20 MG tablet TAKE 1 TABLET EVERY MORNING  . [DISCONTINUED] simvastatin (ZOCOR) 80 MG tablet TAKE 1 TABLET (80 MG TOTAL) BY MOUTH DAILY AT 6 PM    No Known Allergies     Review of Systems:  Constitutional: No recent illness  HEENT: No  headache, no vision change  Cardiac: No  chest pain,  No  pressure, No palpitations  Respiratory:  No  shortness of breath. No  Cough  Gastrointestinal: No  abdominal pain  Musculoskeletal: No new myalgia/arthralgia  Skin: No  Rash  Hem/Onc: No  easy bruising/bleeding, No  abnormal lumps/bumps  Neurologic: No  weakness, No  Dizziness  Psychiatric: No  concerns with depression, No  concerns with anxiety  Exam:  BP 130/67 (BP Location: Left Arm, Patient Position: Sitting, Cuff Size: Normal)   Pulse 73   Temp 98.1 F (36.7 C) (Oral)   Wt 271 lb 4.8 oz (123.1 kg)   BMI 38.93 kg/m   Constitutional: VS see above. General Appearance: alert,  well-developed, well-nourished, NAD  Eyes: Normal lids and conjunctive, non-icteric sclera  Ears, Nose, Mouth, Throat: MMM, Normal external inspection ears/nares/mouth/lips/gums.  Neck: No masses, trachea midline.   Respiratory: Normal respiratory effort. no wheeze, no rhonchi, no rales  Cardiovascular: S1/S2 normal, no murmur, no rub/gallop auscultated. RRR.  Neurological: Normal balance/coordination. No tremor.  Skin: warm, dry, intact.   Psychiatric: Normal judgment/insight. Normal mood and affect. Oriented x3.       Visit summary with medication list and pertinent instructions was printed for patient to review, patient was advised to alert [place] if any updates are needed. All questions at time of visit were answered - patient instructed to contact office with any additional concerns. ER/RTC precautions were reviewed with the patient and understanding verbalized.   Note: Total time spent 15 minutes, greater than 50% of the visit was spent face-to-face counseling and coordinating care for the following: The primary encounter diagnosis was Prediabetes. Diagnoses of Need for influenza vaccination, Essential hypertension, Anxiety, and Hyperlipidemia, unspecified hyperlipidemia type were also pertinent to this visit.Marland Kitchen  Please note: voice recognition software was used to produce this document, and typos may escape review. Please contact Dr. Sheppard Coil for any needed clarifications.    Follow up plan: Return in about 4 months (around 04/04/2019) for annual physical / wellness check-up .

## 2018-12-04 NOTE — Patient Instructions (Signed)
Plan: Labs in 6 weeks or so to check on cholesterol  See me in a few months for annual check-up!

## 2018-12-04 NOTE — Progress Notes (Signed)
Subjective:    CC: Knee pain  HPI: For years this pleasant 63 year old male with known knee osteoarthritis has had pain in his right knee, medial joint line, moderate gelling, no mechanical symptoms or trauma.  He did have a steroid injection at an outside facility, no relief, not even temporary.  Uses an occasional ibuprofen, has never used a long-acting NSAID.  He does have a brace that does seem to help.  Ultimately he is not interested in any further nonoperative measures, he would like an arthroplasty.  I reviewed the past medical history, family history, social history, surgical history, and allergies today and no changes were needed.  Please see the problem list section below in epic for further details.  Past Medical History: Past Medical History:  Diagnosis Date  . Anxiety 11/17/2012  . HYPERLIPIDEMIA 11/29/2009  . HYPERTENSION 11/29/2009  . Psoriasis 11/17/2012   Past Surgical History: No past surgical history on file. Social History: Social History   Socioeconomic History  . Marital status: Married    Spouse name: Not on file  . Number of children: Not on file  . Years of education: Not on file  . Highest education level: Not on file  Occupational History  . Not on file  Social Needs  . Financial resource strain: Not on file  . Food insecurity:    Worry: Not on file    Inability: Not on file  . Transportation needs:    Medical: Not on file    Non-medical: Not on file  Tobacco Use  . Smoking status: Former Smoker    Types: Cigarettes    Last attempt to quit: 1996    Years since quitting: 24.0  . Smokeless tobacco: Never Used  Substance and Sexual Activity  . Alcohol use: Yes  . Drug use: No  . Sexual activity: Not on file  Lifestyle  . Physical activity:    Days per week: Not on file    Minutes per session: Not on file  . Stress: Not on file  Relationships  . Social connections:    Talks on phone: Not on file    Gets together: Not on file   Attends religious service: Not on file    Active member of club or organization: Not on file    Attends meetings of clubs or organizations: Not on file    Relationship status: Not on file  Other Topics Concern  . Not on file  Social History Narrative  . Not on file   Family History: Family History  Problem Relation Age of Onset  . Hypertension Father    Allergies: No Known Allergies Medications: See med rec.  Review of Systems: No fevers, chills, night sweats, weight loss, chest pain, or shortness of breath.   Objective:    General: Well Developed, well nourished, and in no acute distress.  Neuro: Alert and oriented x3, extra-ocular muscles intact, sensation grossly intact.  HEENT: Normocephalic, atraumatic, pupils equal round reactive to light, neck supple, no masses, no lymphadenopathy, thyroid nonpalpable.  Skin: Warm and dry, no rashes. Cardiac: Regular rate and rhythm, no murmurs rubs or gallops, no lower extremity edema.  Respiratory: Clear to auscultation bilaterally. Not using accessory muscles, speaking in full sentences. Right knee: Minimally swollen, tender at the medial joint line, mild varus deformity.   ROM normal in flexion and extension and lower leg rotation. Ligaments with solid consistent endpoints including ACL, PCL, LCL, MCL. Negative Mcmurray's and provocative meniscal tests. Non painful patellar compression. Patellar  and quadriceps tendons unremarkable. Hamstring and quadriceps strength is normal.  Impression and Recommendations:    Primary osteoarthritis of right knee Moderate osteoarthritis of the right knee. He did have a steroid injection at an outside facility, he tells me he had no relief, not even temporary which makes me think the injection ended up periarticular rather than intra-articular. That being said he is not interested in Visco supplementation, genicular RFA, he is agreeable to proceed with arthroplasty. Adding meloxicam, he will  continue his bracing, referral to Dr. Berenice Primas. ___________________________________________ Gwen Her. Dianah Field, M.D., ABFM., CAQSM. Primary Care and Sports Medicine [facility]  Adjunct Professor of [employer]

## 2018-12-17 LAB — LIPID PANEL
Cholesterol: 165 mg/dL (ref <200)
HDL: 48 mg/dL (ref >40)
LDL Cholesterol (Calc): 91 mg/dL (calc)
NON-HDL CHOLESTEROL (CALC): 117 mg/dL (ref <130)
TRIGLYCERIDES: 164 mg/dL — AB (ref <150)
Total CHOL/HDL Ratio: 3.4 (calc) (ref <5.0)

## 2018-12-17 LAB — COMPLETE METABOLIC PANEL WITH GFR
AG RATIO: 1.6 (calc) (ref 1.0–2.5)
ALKALINE PHOSPHATASE (APISO): 48 U/L (ref 40–115)
ALT: 22 U/L (ref 9–46)
AST: 21 U/L (ref 10–35)
Albumin: 4.4 g/dL (ref 3.6–5.1)
BILIRUBIN TOTAL: 1.3 mg/dL — AB (ref 0.2–1.2)
BUN: 20 mg/dL (ref 7–25)
CHLORIDE: 99 mmol/L (ref 98–110)
CO2: 24 mmol/L (ref 20–32)
Calcium: 9.6 mg/dL (ref 8.6–10.3)
Creat: 1.22 mg/dL (ref 0.70–1.25)
GFR, EST NON AFRICAN AMERICAN: 62 mL/min/{1.73_m2} (ref >=60)
GFR, Est African American: 72 mL/min/{1.73_m2} (ref >=60)
GLOBULIN: 2.8 g/dL (ref 1.9–3.7)
Glucose, Bld: 94 mg/dL (ref 65–99)
POTASSIUM: 4.7 mmol/L (ref 3.5–5.3)
Sodium: 134 mmol/L — ABNORMAL LOW (ref 135–146)
Total Protein: 7.2 g/dL (ref 6.1–8.1)

## 2018-12-17 LAB — CBC
HEMATOCRIT: 41.9 % (ref 38.5–50.0)
Hemoglobin: 14.9 g/dL (ref 13.2–17.1)
MCH: 34.3 pg — AB (ref 27.0–33.0)
MCHC: 35.6 g/dL (ref 32.0–36.0)
MCV: 96.5 fL (ref 80.0–100.0)
MPV: 10.6 fL (ref 7.5–12.5)
PLATELETS: 305 10*3/uL (ref 140–400)
RBC: 4.34 10*6/uL (ref 4.20–5.80)
RDW: 12.6 % (ref 11.0–15.0)
WBC: 8.9 10*3/uL (ref 3.8–10.8)

## 2019-01-19 ENCOUNTER — Other Ambulatory Visit: Payer: Self-pay | Admitting: Osteopathic Medicine

## 2019-01-19 DIAGNOSIS — F419 Anxiety disorder, unspecified: Secondary | ICD-10-CM

## 2019-01-19 DIAGNOSIS — I10 Essential (primary) hypertension: Secondary | ICD-10-CM

## 2019-04-06 ENCOUNTER — Encounter: Payer: Managed Care, Other (non HMO) | Admitting: Osteopathic Medicine

## 2019-10-20 ENCOUNTER — Encounter: Payer: Self-pay | Admitting: Gastroenterology

## 2019-10-20 ENCOUNTER — Encounter: Payer: Self-pay | Admitting: Osteopathic Medicine

## 2019-10-20 ENCOUNTER — Ambulatory Visit (INDEPENDENT_AMBULATORY_CARE_PROVIDER_SITE_OTHER): Payer: Managed Care, Other (non HMO) | Admitting: Osteopathic Medicine

## 2019-10-20 ENCOUNTER — Other Ambulatory Visit: Payer: Self-pay

## 2019-10-20 VITALS — BP 138/78 | HR 66 | Temp 98.2°F | Wt 280.0 lb

## 2019-10-20 DIAGNOSIS — Z86010 Personal history of colonic polyps: Secondary | ICD-10-CM

## 2019-10-20 DIAGNOSIS — I10 Essential (primary) hypertension: Secondary | ICD-10-CM

## 2019-10-20 DIAGNOSIS — Z0000 Encounter for general adult medical examination without abnormal findings: Secondary | ICD-10-CM

## 2019-10-20 DIAGNOSIS — Z23 Encounter for immunization: Secondary | ICD-10-CM | POA: Diagnosis not present

## 2019-10-20 DIAGNOSIS — R7303 Prediabetes: Secondary | ICD-10-CM | POA: Diagnosis not present

## 2019-10-20 DIAGNOSIS — Z1211 Encounter for screening for malignant neoplasm of colon: Secondary | ICD-10-CM

## 2019-10-20 LAB — POCT GLYCOSYLATED HEMOGLOBIN (HGB A1C): Hemoglobin A1C: 6 % — AB (ref 4.0–5.6)

## 2019-10-20 NOTE — Progress Notes (Signed)
HPI: Nathaniel Montes is a 63 y.o. male who  has a past medical history of Anxiety (11/17/2012), HYPERLIPIDEMIA (11/29/2009), HYPERTENSION (11/29/2009), and Psoriasis (11/17/2012).  he presents to [facility] today, 10/20/19,  for chief complaint of:  "Check up" Follow up prediabetes  Flu vaccine Shingles, pneumonia vaccines     Patient here for annual physical / wellness exam.  See preventive care reviewed as below.    Additional concerns today include:  Is a list from his wife!  He needs colonoscopy, asks about shingles and pneumonia vaccines.  Wife and I have pretty much the same list, see preventive care as reviewed below!   Immunization History  Administered Date(s) Administered  . Influenza,inj,Quad PF,6+ Mos 10/17/2017, 12/04/2018, 10/20/2019  . Td 02/04/2007  . Tdap 10/17/2017      At today's visit 10/20/19 ... PMH, PSH, FH reviewed and updated as needed.  Current medication list and allergy/intolerance hx reviewed and updated as needed. (See remainder of HPI, ROS, Phys Exam below)   No results found.  Results for orders placed or performed in visit on 10/20/19 (from the past 72 hour(s))  POCT HgB A1C     Status: Abnormal   Collection Time: 10/20/19  8:18 AM  Result Value Ref Range   Hemoglobin A1C 6.0 (A) 4.0 - 5.6 %   HbA1c POC (<> result, manual entry)     HbA1c, POC (prediabetic range)     HbA1c, POC (controlled diabetic range)            ASSESSMENT/PLAN: The primary encounter diagnosis was Annual physical exam. Diagnoses of Prediabetes, Need for influenza vaccination, Essential hypertension, History of adenomatous polyp of colon, and Colon cancer screening were also pertinent to this visit.   Orders Placed This Encounter  Procedures  . Flu Vaccine QUAD 6+ mos PF IM (Fluarix Quad PF)  . CBC  . COMPLETE METABOLIC PANEL WITH GFR  . LIPID SCREENING  . PSA SOLSTAS  . Ambulatory referral to Gastroenterology  .  POCT HgB A1C       Patient Instructions  General Preventive Care  Most recent routine screening lipids/other labs: due 12/2019  Tobacco: don't!   Alcohol: responsible moderation is ok for most adults - if you have concerns about your alcohol intake, please talk to me!   Exercise: as tolerated to reduce risk of cardiovascular disease and diabetes. Strength training will also prevent osteoporosis.   Mental health: if need for mental health care (medicines, counseling, other), or concerns about moods, please let me know!   Sexual health: if need for STD testing, or if concerns with libido/pain problems, please let me know!  Advanced Directive: Living Will and/or Healthcare Power of Attorney recommended for all adults, regardless of age or health.  Vaccines  Flu vaccine: recommended for almost everyone, every fall.   Shingles vaccine: Shingrix recommended after age 5.  Pneumonia vaccines: Pneumovax recommended after age 34  Tetanus booster: Tdap recommended every 10 years. Due 2028.  Cancer screenings   Colon cancer screening: colonoscopy overdue!   Prostate cancer screening: PSA blood test annually age 67-71  Lung cancer screening: not needed since quit more than 15 years ago!  Infection screenings . HIV: recommended screening at least once age 97-65 . Gonorrhea/Chlamydia: screening as needed.  . Hepatitis C: recommended once for anyone born 61-1967 . TB: certain at-risk populations, or depending on work requirements and/or travel history Other . Bone Density Test: recommended for men at age 60, sooner depending  on risk factors . Abdominal Aortic Aneurysm: screening with ultrasound recommended once for men age 76-75 who have EVER smoked       Follow-up plan: Return in about 6 months (around 04/18/2020) for follow-up on perdiabetes, blood pressure. NURSE VISIT VACCINES: Shingrix,  Pneumonia-23.                                                 ################################################# ################################################# ################################################# #################################################    Current Meds  Medication Sig  . etanercept (ENBREL) 50 MG/ML injection Inject 50 mg into the skin once a week.  Marland Kitchen lisinopril-hydrochlorothiazide (PRINZIDE,ZESTORETIC) 20-25 MG tablet Take 1 tablet by mouth daily.  . meloxicam (MOBIC) 15 MG tablet One tab PO qAM with breakfast for 2 weeks, then daily prn pain.  Marland Kitchen PARoxetine (PAXIL) 20 MG tablet Take 1 tablet (20 mg total) by mouth every morning.  . simvastatin (ZOCOR) 80 MG tablet Take 1 tablet (80 mg total) by mouth daily.    No Known Allergies     Review of Systems:  Constitutional: No recent illness  HEENT: No  headache, no vision change  Cardiac: No  chest pain, No  pressure, No palpitations  Respiratory:  No  shortness of breath. No  Cough  Gastrointestinal: No  abdominal pain, no change on bowel habits  Musculoskeletal: No new myalgia/arthralgia  Skin: No  Rash  Hem/Onc: No  easy bruising/bleeding, No  abnormal lumps/bumps  Neurologic: No  weakness, No  Dizziness  Psychiatric: No  concerns with depression, No  concerns with anxiety  Exam:  BP 138/78 (BP Location: Left Arm, Patient Position: Sitting, Cuff Size: Large)   Pulse 66   Temp 98.2 F (36.8 C) (Oral)   Wt 280 lb 0.6 oz (127 kg)   BMI 40.18 kg/m   Constitutional: VS see above. General Appearance: alert, well-developed, well-nourished, NAD  Eyes: Normal lids and conjunctive, non-icteric sclera  Ears, Nose, Mouth, Throat: MMM, Normal external inspection ears/nares/mouth/lips/gums.  Neck: No masses, trachea midline.   Respiratory: Normal respiratory effort. no wheeze, no rhonchi, no rales  Cardiovascular: S1/S2 normal, no murmur, no  rub/gallop auscultated. RRR.   Musculoskeletal: Gait normal. Symmetric and independent movement of all extremities  Abdominal: non-tender, non-distended, no appreciable organomegaly, neg Murphy's, BS WNLx4  Neurological: Normal balance/coordination. No tremor.  Skin: warm, dry, intact.   Psychiatric: Normal judgment/insight. Normal mood and affect. Oriented x3.       Visit summary with medication list and pertinent instructions was printed for patient to review, patient was advised to alert [place] if any updates are needed. All questions at time of visit were answered - patient instructed to contact office with any additional concerns. ER/RTC precautions were reviewed with the patient and understanding verbalized.      Please note: voice recognition software was used to produce this document, and typos may escape review. Please contact Dr. Sheppard Coil for any needed clarifications.    Follow up plan: Return in about 6 months (around 04/18/2020) for follow-up on perdiabetes, blood pressure. NURSE VISIT VACCINES: Shingrix, Pneumonia-23.

## 2019-10-20 NOTE — Patient Instructions (Addendum)
General Preventive Care  Most recent routine screening lipids/other labs: due 12/2019  Tobacco: don't!   Alcohol: responsible moderation is ok for most adults - if you have concerns about your alcohol intake, please talk to me!   Exercise: as tolerated to reduce risk of cardiovascular disease and diabetes. Strength training will also prevent osteoporosis.   Mental health: if need for mental health care (medicines, counseling, other), or concerns about moods, please let me know!   Sexual health: if need for STD testing, or if concerns with libido/pain problems, please let me know!  Advanced Directive: Living Will and/or Healthcare Power of Attorney recommended for all adults, regardless of age or health.  Vaccines  Flu vaccine: recommended for almost everyone, every fall.   Shingles vaccine: Shingrix recommended after age 31.  Pneumonia vaccines: Pneumovax recommended after age 26  Tetanus booster: Tdap recommended every 10 years. Due 2028.  Cancer screenings   Colon cancer screening: colonoscopy overdue!   Prostate cancer screening: PSA blood test annually age 58-71  Lung cancer screening: not needed since quit more than 15 years ago!  Infection screenings . HIV: recommended screening at least once age 43-65 . Gonorrhea/Chlamydia: screening as needed.  . Hepatitis C: recommended once for anyone born 87-1967 . TB: certain at-risk populations, or depending on work requirements and/or travel history Other . Bone Density Test: recommended for men at age 66, sooner depending on risk factors . Abdominal Aortic Aneurysm: screening with ultrasound recommended once for men age 80-75 who have EVER smoked

## 2019-10-21 LAB — CBC
HCT: 43.1 % (ref 38.5–50.0)
Hemoglobin: 14.8 g/dL (ref 13.2–17.1)
MCH: 33.8 pg — ABNORMAL HIGH (ref 27.0–33.0)
MCHC: 34.3 g/dL (ref 32.0–36.0)
MCV: 98.4 fL (ref 80.0–100.0)
MPV: 10.9 fL (ref 7.5–12.5)
Platelets: 252 10*3/uL (ref 140–400)
RBC: 4.38 10*6/uL (ref 4.20–5.80)
RDW: 12 % (ref 11.0–15.0)
WBC: 7.7 10*3/uL (ref 3.8–10.8)

## 2019-10-21 LAB — LIPID PANEL
Cholesterol: 142 mg/dL (ref <200)
HDL: 47 mg/dL (ref >=40)
LDL Cholesterol (Calc): 77 mg/dL (calc)
Non-HDL Cholesterol (Calc): 95 mg/dL (calc) (ref <130)
Total CHOL/HDL Ratio: 3 (calc) (ref <5.0)
Triglycerides: 98 mg/dL (ref <150)

## 2019-10-21 LAB — COMPLETE METABOLIC PANEL WITH GFR
AG Ratio: 1.9 (calc) (ref 1.0–2.5)
ALT: 30 U/L (ref 9–46)
AST: 25 U/L (ref 10–35)
Albumin: 4.6 g/dL (ref 3.6–5.1)
Alkaline phosphatase (APISO): 55 U/L (ref 35–144)
BUN: 19 mg/dL (ref 7–25)
CO2: 26 mmol/L (ref 20–32)
Calcium: 10 mg/dL (ref 8.6–10.3)
Chloride: 100 mmol/L (ref 98–110)
Creat: 1.18 mg/dL (ref 0.70–1.25)
GFR, Est African American: 75 mL/min/{1.73_m2} (ref >=60)
GFR, Est Non African American: 64 mL/min/{1.73_m2} (ref >=60)
Globulin: 2.4 g/dL (calc) (ref 1.9–3.7)
Glucose, Bld: 132 mg/dL — ABNORMAL HIGH (ref 65–99)
Potassium: 4.6 mmol/L (ref 3.5–5.3)
Sodium: 137 mmol/L (ref 135–146)
Total Bilirubin: 0.8 mg/dL (ref 0.2–1.2)
Total Protein: 7 g/dL (ref 6.1–8.1)

## 2019-10-21 LAB — PSA, TOTAL WITH REFLEX TO PSA, FREE: PSA, Total: 1.2 ng/mL (ref <=4.0)

## 2019-10-22 ENCOUNTER — Other Ambulatory Visit: Payer: Self-pay | Admitting: *Deleted

## 2019-10-23 ENCOUNTER — Ambulatory Visit: Payer: Managed Care, Other (non HMO) | Admitting: Osteopathic Medicine

## 2019-10-23 ENCOUNTER — Other Ambulatory Visit: Payer: Self-pay

## 2019-10-23 VITALS — BP 120/63 | HR 90

## 2019-10-23 DIAGNOSIS — Z23 Encounter for immunization: Secondary | ICD-10-CM

## 2019-10-23 NOTE — Progress Notes (Signed)
Pt here today to receive PNE 23 and Shingrix vaccinations. Pt tolerated both vaccinations well.  I advised him to RTC in @ months for next Shingrix vaccination. Maryruth Eve, Lahoma Crocker, CMA

## 2019-11-05 ENCOUNTER — Other Ambulatory Visit: Payer: Self-pay

## 2019-11-05 ENCOUNTER — Ambulatory Visit (AMBULATORY_SURGERY_CENTER): Payer: Managed Care, Other (non HMO)

## 2019-11-05 ENCOUNTER — Encounter: Payer: Self-pay | Admitting: Gastroenterology

## 2019-11-05 VITALS — Temp 98.6°F | Ht 70.0 in | Wt 287.0 lb

## 2019-11-05 DIAGNOSIS — Z86010 Personal history of colonic polyps: Secondary | ICD-10-CM

## 2019-11-05 DIAGNOSIS — Z1159 Encounter for screening for other viral diseases: Secondary | ICD-10-CM

## 2019-11-05 MED ORDER — NA SULFATE-K SULFATE-MG SULF 17.5-3.13-1.6 GM/177ML PO SOLN: 1.0000 | Freq: Once | ORAL | 0 refills | Status: AC

## 2019-11-05 NOTE — Progress Notes (Signed)

## 2019-11-12 ENCOUNTER — Ambulatory Visit (INDEPENDENT_AMBULATORY_CARE_PROVIDER_SITE_OTHER): Payer: Managed Care, Other (non HMO)

## 2019-11-12 ENCOUNTER — Other Ambulatory Visit: Payer: Self-pay | Admitting: Gastroenterology

## 2019-11-12 DIAGNOSIS — Z1159 Encounter for screening for other viral diseases: Secondary | ICD-10-CM

## 2019-11-12 LAB — SARS CORONAVIRUS 2 (TAT 6-24 HRS): SARS Coronavirus 2: NEGATIVE

## 2019-11-17 ENCOUNTER — Other Ambulatory Visit: Payer: Self-pay

## 2019-11-17 ENCOUNTER — Encounter: Payer: Self-pay | Admitting: Gastroenterology

## 2019-11-17 ENCOUNTER — Ambulatory Visit (AMBULATORY_SURGERY_CENTER): Payer: Managed Care, Other (non HMO) | Admitting: Gastroenterology

## 2019-11-17 VITALS — BP 129/78 | HR 71 | Temp 98.5°F | Resp 17 | Ht 70.0 in | Wt 287.0 lb

## 2019-11-17 DIAGNOSIS — D125 Benign neoplasm of sigmoid colon: Secondary | ICD-10-CM

## 2019-11-17 DIAGNOSIS — Z86010 Personal history of colonic polyps: Secondary | ICD-10-CM | POA: Diagnosis not present

## 2019-11-17 DIAGNOSIS — D122 Benign neoplasm of ascending colon: Secondary | ICD-10-CM

## 2019-11-17 DIAGNOSIS — D124 Benign neoplasm of descending colon: Secondary | ICD-10-CM | POA: Diagnosis not present

## 2019-11-17 DIAGNOSIS — D123 Benign neoplasm of transverse colon: Secondary | ICD-10-CM

## 2019-11-17 MED ORDER — SODIUM CHLORIDE 0.9 % IV SOLN: 500.0000 mL | Freq: Once | INTRAVENOUS | Status: DC

## 2019-11-17 NOTE — Progress Notes (Signed)
VS-DT Temp-Christy  Pt's states no medical or surgical changes since previsit or office visit.

## 2019-11-17 NOTE — Patient Instructions (Signed)
Thank you for letting us take care of your healthcare needs today. Please see handouts given to you on Polyps and Diverticulosis.      YOU HAD AN ENDOSCOPIC PROCEDURE TODAY AT [facility]:   Refer to the procedure report that was given to you for any specific questions about what was found during the examination.  If the procedure report does not answer your questions, please call your gastroenterologist to clarify.  If you requested that your care partner not be given the details of your procedure findings, then the procedure report has been included in a sealed envelope for you to review at your convenience later.  YOU SHOULD EXPECT: Some feelings of bloating in the abdomen. Passage of more gas than usual.  Walking can help get rid of the air that was put into your GI tract during the procedure and reduce the bloating. If you had a lower endoscopy (such as a colonoscopy or flexible sigmoidoscopy) you may notice spotting of blood in your stool or on the toilet paper. If you underwent a bowel prep for your procedure, you may not have a normal bowel movement for a few days.  Please Note:  You might notice some irritation and congestion in your nose or some drainage.  This is from the oxygen used during your procedure.  There is no need for concern and it should clear up in a day or so.  SYMPTOMS TO REPORT IMMEDIATELY:   Following lower endoscopy (colonoscopy or flexible sigmoidoscopy):  Excessive amounts of blood in the stool  Significant tenderness or worsening of abdominal pains  Swelling of the abdomen that is new, acute  Fever of 100F or higher   For urgent or emergent issues, a gastroenterologist can be reached at any hour by calling [phone].   DIET:  We do recommend a small meal at first, but then you may proceed to your regular diet.  Drink plenty of fluids but you should avoid alcoholic beverages for 24 hours.  ACTIVITY:  You should plan to take it easy for  the rest of today and you should NOT DRIVE or use heavy machinery until tomorrow (because of the sedation medicines used during the test).    FOLLOW UP: Our staff will call the number listed on your records 48-72 hours following your procedure to check on you and address any questions or concerns that you may have regarding the information given to you following your procedure. If we do not reach you, we will leave a message.  We will attempt to reach you two times.  During this call, we will ask if you have developed any symptoms of COVID 19. If you develop any symptoms (ie: fever, flu-like symptoms, shortness of breath, cough etc.) before then, please call [phone].  If you test positive for Covid 19 in the 2 weeks post procedure, please call and report this information to [place].    If any biopsies were taken you will be contacted by phone or by letter within the next 1-3 weeks.  Please call us at [phone] if you have not heard about the biopsies in 3 weeks.    SIGNATURES/CONFIDENTIALITY: You and/or your care partner have signed paperwork which will be entered into your electronic medical record.  These signatures attest to the fact that that the information above on your After Visit Summary has been reviewed and is understood.  Full responsibility of the confidentiality of this discharge information lies with you and/or your care-partner.

## 2019-11-17 NOTE — Progress Notes (Signed)
Report to PACU, RN, vss, BBS= Clear.  

## 2019-11-17 NOTE — Op Note (Signed)
[name] Patient Name: Nathaniel Montes Procedure Date: 11/17/2019 7:50 AM MRN: [id number] Endoscopist: Milus Banister , MD Age: 63 Referring MD:  Date of Birth: Aug 15, 1956 Gender: Male Account #: [id number] Procedure:                Colonoscopy Indications:              High risk colon cancer surveillance: Personal                            history of colonic polyps; Colonoscopy 2008 4 subCM                            adenomas; Colonoscopy 2011 several subCM HPs Medicines:                Monitored Anesthesia Care Procedure:                Pre-Anesthesia Assessment:                           - Prior to the procedure, a History and Physical                            was performed, and patient medications and                            allergies were reviewed. The patient's tolerance of                            previous anesthesia was also reviewed. The risks                            and benefits of the procedure and the sedation                            options and risks were discussed with the patient.                            All questions were answered, and informed consent                            was obtained. Prior Anticoagulants: The patient has                            taken no previous anticoagulant or antiplatelet                            agents. ASA Grade Assessment: III - A patient with                            severe systemic disease. After reviewing the risks                            and benefits, the patient was deemed in  satisfactory condition to undergo the procedure.                           After obtaining informed consent, the colonoscope                            was passed under direct vision. Throughout the                            procedure, the patient's blood pressure, pulse, and                            oxygen saturations were monitored continuously. The                            Colonoscope  was introduced through the anus and                            advanced to the the cecum, identified by                            appendiceal orifice and ileocecal valve. The                            colonoscopy was performed without difficulty. The                            patient tolerated the procedure well. The quality                            of the bowel preparation was good. The ileocecal                            valve, appendiceal orifice, and rectum were                            photographed. Scope In: 8:05:22 AM Scope Out: 8:22:43 AM Scope Withdrawal Time: 0 hours 15 minutes 34 seconds  Total Procedure Duration: 0 hours 17 minutes 21 seconds  Findings:                 Eight sessile polyps were found in the descending                            colon, transverse colon and ascending colon. The                            polyps were 2 to 9 mm in size. These polyps were                            removed with a cold snare. Resection and retrieval                            were complete.  A 12 mm polyp was found in the sigmoid colon. The                            polyp was semi-pedunculated. The polyp was removed                            with a hot snare. Resection and retrieval were                            complete.                           Multiple small and large-mouthed diverticula were                            found in the left colon.                           The exam was otherwise without abnormality on                            direct and retroflexion views. Complications:            No immediate complications. Estimated blood loss:                            None. Estimated Blood Loss:     Estimated blood loss: none. Impression:               - Eight 2 to 9 mm polyps in the descending colon,                            in the transverse colon and in the ascending colon,                            removed with a cold snare.  Resected and retrieved.                           - One 12 mm polyp in the sigmoid colon, removed                            with a hot snare. Resected and retrieved.                           - Diverticulosis in the left colon.                           - The examination was otherwise normal on direct                            and retroflexion views. Recommendation:           - Patient has a contact number available for  emergencies. The signs and symptoms of potential                            delayed complications were discussed with the                            patient. Return to normal activities tomorrow.                            Written discharge instructions were provided to the                            patient.                           - Resume previous diet.                           - Continue present medications.                           - Await pathology results. Likely to need repeat                            colonoscopy in 3 years. Milus Banister, MD 11/17/2019 8:26:28 AM This report has been signed electronically.

## 2019-11-18 ENCOUNTER — Other Ambulatory Visit: Payer: Self-pay | Admitting: Osteopathic Medicine

## 2019-11-18 DIAGNOSIS — E785 Hyperlipidemia, unspecified: Secondary | ICD-10-CM

## 2019-11-19 ENCOUNTER — Telehealth: Payer: Self-pay | Admitting: *Deleted

## 2019-11-19 NOTE — Telephone Encounter (Signed)
  Follow up Call-  Call back number 11/17/2019  Post procedure Call Back phone  # [phone]  Permission to leave phone message Yes  Some recent data might be hidden     Patient questions:  Do you have a fever, pain , or abdominal swelling? No. Pain Score  0 *  Have you tolerated food without any problems? Yes.    Have you been able to return to your normal activities? Yes.    Do you have any questions about your discharge instructions: Diet   No. Medications  No. Follow up visit  No.  Do you have questions or concerns about your Care? No.  Actions: * If pain score is 4 or above: 1. No action needed, pain <4.Have you developed a fever since your procedure? no  2.   Have you had an respiratory symptoms (SOB or cough) since your procedure? no  3.   Have you tested positive for COVID 19 since your procedure no  4.   Have you had any family members/close contacts diagnosed with the COVID 19 since your procedure?  no   If yes to any of these questions please route to Joylene John, RN and Alphonsa Gin, Therapist, sports.

## 2019-11-20 ENCOUNTER — Encounter: Payer: Self-pay | Admitting: Gastroenterology

## 2019-12-24 ENCOUNTER — Ambulatory Visit (INDEPENDENT_AMBULATORY_CARE_PROVIDER_SITE_OTHER): Payer: Managed Care, Other (non HMO) | Admitting: Osteopathic Medicine

## 2019-12-24 ENCOUNTER — Other Ambulatory Visit: Payer: Self-pay

## 2019-12-24 VITALS — Temp 98.4°F

## 2019-12-24 DIAGNOSIS — Z23 Encounter for immunization: Secondary | ICD-10-CM

## 2019-12-24 NOTE — Progress Notes (Signed)
   Subjective:    Patient ID: Nathaniel Montes, male    DOB: Aug 30, 1956, 64 y.o.   MRN: [id number]  HPI Patient here to get 2nd Shingrix vaccine.   Review of Systems     Objective:   Physical Exam        Assessment & Plan:  Vaccine given in left deltoid without complications. KG LPN

## 2020-01-13 ENCOUNTER — Encounter: Payer: Self-pay | Admitting: Osteopathic Medicine

## 2020-01-29 ENCOUNTER — Other Ambulatory Visit: Payer: Self-pay | Admitting: Osteopathic Medicine

## 2020-01-29 DIAGNOSIS — F419 Anxiety disorder, unspecified: Secondary | ICD-10-CM

## 2020-01-29 DIAGNOSIS — I10 Essential (primary) hypertension: Secondary | ICD-10-CM

## 2020-05-01 ENCOUNTER — Other Ambulatory Visit: Payer: Self-pay | Admitting: Osteopathic Medicine

## 2020-05-01 DIAGNOSIS — F419 Anxiety disorder, unspecified: Secondary | ICD-10-CM

## 2020-05-01 DIAGNOSIS — I10 Essential (primary) hypertension: Secondary | ICD-10-CM

## 2020-06-17 ENCOUNTER — Telehealth: Payer: Self-pay | Admitting: Osteopathic Medicine

## 2020-06-17 NOTE — Telephone Encounter (Signed)
Pls disregard previous msg. Contacted [facility], pt still have refills available at the pharmacy. Pt has been updated and is aware to contact [facility] for their refills. Pt was informed to schedule follow up appointment with provider. No other inquiries during the call.

## 2020-06-17 NOTE — Telephone Encounter (Signed)
Pt came by to request refill on his Simvastatin. He made an appt for August 3rd for f/u on pre-diabetes.

## 2020-06-17 NOTE — Telephone Encounter (Signed)
Patient asking for refill of simvastatin. Patient has not been seen in the office for visit since November of 2020. He has an upcoming appointment August 3rd. What from of refill would be appropriate.

## 2020-07-05 ENCOUNTER — Ambulatory Visit (INDEPENDENT_AMBULATORY_CARE_PROVIDER_SITE_OTHER): Payer: Medicare HMO | Admitting: Osteopathic Medicine

## 2020-07-05 ENCOUNTER — Encounter: Payer: Self-pay | Admitting: Osteopathic Medicine

## 2020-07-05 ENCOUNTER — Other Ambulatory Visit: Payer: Self-pay

## 2020-07-05 VITALS — BP 103/66 | HR 90 | Wt 284.0 lb

## 2020-07-05 DIAGNOSIS — E785 Hyperlipidemia, unspecified: Secondary | ICD-10-CM | POA: Diagnosis not present

## 2020-07-05 DIAGNOSIS — I10 Essential (primary) hypertension: Secondary | ICD-10-CM | POA: Diagnosis not present

## 2020-07-05 DIAGNOSIS — F419 Anxiety disorder, unspecified: Secondary | ICD-10-CM | POA: Diagnosis not present

## 2020-07-05 DIAGNOSIS — Z7189 Other specified counseling: Secondary | ICD-10-CM

## 2020-07-05 DIAGNOSIS — R69 Illness, unspecified: Secondary | ICD-10-CM | POA: Diagnosis not present

## 2020-07-05 LAB — POCT GLYCOSYLATED HEMOGLOBIN (HGB A1C)
HbA1c POC (<> result, manual entry): 5.8 % (ref 4.0–5.6)
HbA1c, POC (controlled diabetic range): 5.8 % (ref 0.0–7.0)
HbA1c, POC (prediabetic range): 5.8 % (ref 5.7–6.4)
Hemoglobin A1C: 5.8 % — AB (ref 4.0–5.6)

## 2020-07-05 MED ORDER — PAROXETINE HCL 20 MG PO TABS: 20.0000 mg | ORAL_TABLET | Freq: Every day | ORAL | 3 refills | Status: DC

## 2020-07-05 MED ORDER — LISINOPRIL-HYDROCHLOROTHIAZIDE 20-25 MG PO TABS: 1.0000 | ORAL_TABLET | Freq: Every day | ORAL | 3 refills | Status: DC

## 2020-07-05 MED ORDER — SIMVASTATIN 80 MG PO TABS: 80.0000 mg | ORAL_TABLET | Freq: Every day | ORAL | 3 refills | Status: DC

## 2020-07-05 NOTE — Patient Instructions (Signed)
If you would like lab work done prior to your annual preventive care visit, so we can go over results in detail when I see you, please call us 1-2 weeks before your visit! I will make sure orders are in place for you to come to the lab 2 days prior to your appointment. -Dr Loni Muse

## 2020-07-05 NOTE — Progress Notes (Signed)
HPI: Nathaniel Montes is a 64 y.o. male who  has a past medical history of Anxiety (11/17/2012), HYPERLIPIDEMIA (11/29/2009), HYPERTENSION (11/29/2009), and Psoriasis (11/17/2012).  he presents to [facility] today, 07/05/20,  for chief complaint of: Follow up hypertension, prediabetes  Patient reports he is doing well. He has no symptoms, concerns, or questions today. He denies headache, vision change, chest pain, shortness of breath, abdominal pain, nausea, vomiting, diarrhea, constipation, frequent thirst or urination, dysuria, leg swelling, paresthesias.   Past medical, surgical, social and family history reviewed:  Patient Active Problem List   Diagnosis Date Noted   Primary osteoarthritis of right knee 04/18/2018   Osteoarthritis of both hands 07/22/2014   History of adenomatous polyp of colon 01/21/2013   Prediabetes 12/19/2012   Anxiety 11/17/2012   Psoriasis 11/17/2012   Hyperlipidemia LDL goal < 130 FramRisk 11% Dec 2013 11/29/2009   Essential hypertension 11/29/2009    Past Surgical History:  Procedure Laterality Date   APPENDECTOMY     COLONOSCOPY  08/16/2010   2008   LEG SURGERY Left    broken bone left lower leg, repaired   meniscus left knee Left    repair   POLYPECTOMY     REPLACEMENT TOTAL KNEE Right 12/2018   SHOULDER SURGERY Right    torn bicep, rotator cuff, ligament work injury    Social History   Tobacco Use   Smoking status: Former Smoker    Types: Cigarettes    Quit date: 1996    Years since quitting: 25.6   Smokeless tobacco: Never Used  Substance Use Topics   Alcohol use: Yes    Comment: 1 case of beer per week    Family History  Problem Relation Age of Onset   Hypertension Father    Colon cancer Neg Hx    Colon polyps Neg Hx    Esophageal cancer Neg Hx    Rectal cancer Neg Hx    Stomach cancer Neg Hx      Current medication list and allergy/intolerance information  reviewed:    Current Outpatient Medications  Medication Sig Dispense Refill   BABY ASPIRIN PO Take 81 mg by mouth daily.     lisinopril-hydrochlorothiazide (ZESTORETIC) 20-25 MG tablet Take 1 tablet by mouth daily. 90 tablet 3   PARoxetine (PAXIL) 20 MG tablet Take 1 tablet (20 mg total) by mouth daily. 90 tablet 3   simvastatin (ZOCOR) 80 MG tablet Take 1 tablet (80 mg total) by mouth daily. 90 tablet 3   No current facility-administered medications for this visit.    No Known Allergies    Review of Systems:  Constitutional:  No  fever, no chills,  HEENT: No  headache, no vision change  Cardiac: No  chest pain  Respiratory:  No  shortness of breath. No  Cough  Gastrointestinal: No  abdominal pain, No  nausea, No  vomiting,  No  diarrhea, No  constipation   Musculoskeletal: No new myalgia/arthralgia  Skin: No  Rash, No other wounds/concerning lesions  Endocrine: No cold intolerance,  No heat intolerance. No polyuria/polydipsia/polyphagia   Neurologic: No  weakness, No  dizziness  Psychiatric: No  concerns with depression, No  concerns with anxiety, No sleep problems, No mood problems  Exam:  BP 103/66 (BP Location: Left Arm, Patient Position: Sitting)    Pulse 90    Wt 284 lb (128.8 kg)    SpO2 95%    BMI 40.75 kg/m   Constitutional: VS see above.  General Appearance: alert, well-developed, well-nourished, NAD  Eyes: Normal lids and conjunctive, non-icteric sclera  Neck: No masses, trachea midline.  Respiratory: Normal respiratory effort. no wheeze, no rhonchi, no rales  Cardiovascular: S1/S2 normal, no murmur, no rub/gallop auscultated. RRR. No lower extremity edema.   Gastrointestinal: Nontender, no masses.  Musculoskeletal: Gait normal. No clubbing/cyanosis of digits.   Neurological: Normal balance/coordination. No tremor. No cranial nerve deficit on limited exam.   Skin: warm, dry, intact. No rash/ulcer. No concerning nevi or subq nodules on limited  exam.    Psychiatric: Normal judgment/insight. Normal mood and affect. Oriented x3.    No results found for this or any previous visit (from the past 72 hour(s)).  No results found.       ASSESSMENT/PLAN: Diagnoses of Anxiety, Essential hypertension, and Hyperlipidemia, unspecified hyperlipidemia type were pertinent to this visit.   Hypertension  BP 103/66 today. Controlled.  Continue lisinopril-HCTZ at current dose.  Prediabetes  HbA1c 5.8%. No symptoms.  Recheck in 6 months at annual physical  No orders of the defined types were placed in this encounter.   Meds ordered this encounter  Medications   DISCONTD: PARoxetine (PAXIL) 20 MG tablet    Sig: Take 1 tablet (20 mg total) by mouth daily.    Dispense:  90 tablet    Refill:  3   DISCONTD: lisinopril-hydrochlorothiazide (ZESTORETIC) 20-25 MG tablet    Sig: Take 1 tablet by mouth daily.    Dispense:  90 tablet    Refill:  3   DISCONTD: simvastatin (ZOCOR) 80 MG tablet    Sig: Take 1 tablet (80 mg total) by mouth daily.    Dispense:  90 tablet    Refill:  3   lisinopril-hydrochlorothiazide (ZESTORETIC) 20-25 MG tablet    Sig: Take 1 tablet by mouth daily.    Dispense:  90 tablet    Refill:  3   PARoxetine (PAXIL) 20 MG tablet    Sig: Take 1 tablet (20 mg total) by mouth daily.    Dispense:  90 tablet    Refill:  3   simvastatin (ZOCOR) 80 MG tablet    Sig: Take 1 tablet (80 mg total) by mouth daily.    Dispense:  90 tablet    Refill:  3    Patient Instructions  If you would like lab work done prior to your annual preventive care visit, so we can go over results in detail when I see you, please call us 1-2 weeks before your visit! I will make sure orders are in place for you to come to the lab 2 days prior to your appointment. -Dr A             Visit summary with medication list and pertinent instructions was printed for patient to review. All questions at time of visit were answered -  patient instructed to contact office with any additional concerns or updates. ER/RTC precautions were reviewed with the patient.   Note: Total time spent 20 minutes, greater than 50% of the visit was spent face-to-face counseling and coordinating care for the above diagnoses listed in assessment/plan.   Please note: voice recognition software was used to produce this document, and typos may escape review. Please contact Dr. Sheppard Coil for any needed clarifications.     Follow-up plan: Return in about 6 months (around 01/05/2021) for ANNUAL (call week prior to visit for lab orders for A1C, PSA, Lipids, CMP, CBC) .  Gweneth Dimitri, [facility] MS3

## 2020-07-07 ENCOUNTER — Encounter: Payer: Self-pay | Admitting: Osteopathic Medicine

## 2020-10-06 DIAGNOSIS — R69 Illness, unspecified: Secondary | ICD-10-CM | POA: Diagnosis not present

## 2020-11-20 ENCOUNTER — Encounter: Payer: Self-pay | Admitting: Emergency Medicine

## 2020-11-20 ENCOUNTER — Other Ambulatory Visit: Payer: Self-pay

## 2020-11-20 ENCOUNTER — Emergency Department: Admit: 2020-11-20 | Discharge status: Against Medical Advice | Payer: Self-pay

## 2020-11-20 ENCOUNTER — Emergency Department
Admission: EM | Admit: 2020-11-20 | Discharge: 2020-11-20 | Disposition: A | Discharge status: Home / Self Care | Payer: Medicare HMO | Source: Home / Self Care

## 2020-11-20 DIAGNOSIS — R0981 Nasal congestion: Secondary | ICD-10-CM

## 2020-11-20 MED ORDER — FLUTICASONE PROPIONATE 50 MCG/ACT NA SUSP
2.0000 | Freq: Every day | NASAL | 2 refills | Status: DC
Start: 2020-11-20 — End: 2022-07-12

## 2020-11-20 NOTE — ED Triage Notes (Signed)
cough & congestion since Friday OTC - dayquil - none today

## 2020-11-20 NOTE — Discharge Instructions (Addendum)
Drink more water Use the Flonase Call or return if not getting better in a few days May add mucinex DM if needed

## 2020-11-30 ENCOUNTER — Telehealth: Payer: Self-pay | Admitting: General Practice

## 2020-12-07 NOTE — Telephone Encounter (Signed)
Documentation

## 2021-06-20 ENCOUNTER — Other Ambulatory Visit: Payer: Self-pay

## 2021-06-20 ENCOUNTER — Ambulatory Visit (INDEPENDENT_AMBULATORY_CARE_PROVIDER_SITE_OTHER): Payer: Medicare HMO | Admitting: Medical-Surgical

## 2021-06-20 VITALS — BP 146/66 | HR 68 | Ht 70.0 in | Wt 295.0 lb

## 2021-06-20 DIAGNOSIS — Z23 Encounter for immunization: Secondary | ICD-10-CM

## 2021-06-20 DIAGNOSIS — Z0000 Encounter for general adult medical examination without abnormal findings: Secondary | ICD-10-CM

## 2021-06-20 NOTE — Patient Instructions (Signed)
[facility] Maintenance Summary and Written Plan of Care  Mr. Nathaniel Montes ,  Thank you for allowing me to perform your Medicare Annual Wellness Visit and for your ongoing commitment to your health.   Health Maintenance & Immunization History Health Maintenance  Topic Date Due   COVID-19 Vaccine (4 - Booster for Moderna series) 07/06/2021 (Originally 02/28/2021)   INFLUENZA VACCINE  07/03/2021   PNA vac Low Risk Adult (2 of 2 - PCV13) 06/20/2022   COLONOSCOPY (Pts 45-37yrs Insurance coverage will need to be confirmed)  11/16/2022   TETANUS/TDAP  10/18/2027   Zoster Vaccines- Shingrix  Completed   HPV VACCINES  Aged Out   Hepatitis C Screening  Discontinued   Immunization History  Administered Date(s) Administered   Influenza,inj,Quad PF,6+ Mos 10/17/2017, 12/04/2018, 10/20/2019   Influenza-Unspecified 10/06/2020   Moderna Sars-Covid-2 Vaccination 01/16/2020, 02/13/2020, 10/31/2020   PNEUMOCOCCAL CONJUGATE-20 06/20/2021   Pneumococcal Polysaccharide-23 10/23/2019   Td 02/04/2007   Tdap 10/17/2017   Zoster Recombinat (Shingrix) 10/23/2019, 12/24/2019    These are the patient goals that we discussed:  Goals Addressed               This Visit's Progress     Patient Stated (pt-stated)        06/20/2021 AWV Goal: Exercise for General Health  Patient will verbalize understanding of the benefits of increased physical activity: Exercising regularly is important. It will improve your overall fitness, flexibility, and endurance. Regular exercise also will improve your overall health. It can help you control your weight, reduce stress, and improve your bone density. Over the next year, patient will increase physical activity as tolerated with a goal of at least 150 minutes of moderate physical activity per week.  You can tell that you are exercising at a moderate intensity if your heart starts beating faster and you start breathing faster but can still hold a  conversation. Moderate-intensity exercise ideas include: Walking 1 mile (1.6 km) in about 15 minutes Biking Hiking Golfing Dancing Water aerobics Patient will verbalize understanding of everyday activities that increase physical activity by providing examples like the following: Yard work, such as: Sales promotion account executive Gardening Washing windows or floors Patient will be able to explain general safety guidelines for exercising:  Before you start a new exercise program, talk with your health care provider. Do not exercise so much that you hurt yourself, feel dizzy, or get very short of breath. Wear comfortable clothes and wear shoes with good support. Drink plenty of water while you exercise to prevent dehydration or heat stroke. Work out until your breathing and your heartbeat get faster.          This is a list of Health Maintenance Items that are overdue or due now: There are no preventive care reminders to display for this patient.   Orders/Referrals Placed Today: Orders Placed This Encounter  Procedures   Pneumococcal conjugate vaccine 20-valent (Prevnar 20)   (Contact our referral department at [phone] if you have not spoken with someone about your referral appointment within the next 5 days)    Follow-up Plan Follow-up with Emeterio Reeve, DO as planned Medicare wellness visit in one year.  AVS printed and given to the patient.     Health Maintenance, Male Adopting a healthy lifestyle and getting preventive care are important in promoting health and wellness. Ask your health care provider about: The right schedule for you  to have regular tests and exams. Things you can do on your own to prevent diseases and keep yourself healthy. What should I know about diet, weight, and exercise? Eat a healthy diet  Eat a diet that includes plenty of vegetables, fruits, low-fat dairy  products, and lean protein. Do not eat a lot of foods that are high in solid fats, added sugars, or sodium.  Maintain a healthy weight Body mass index (BMI) is a measurement that can be used to identify possible weight problems. It estimates body fat based on height and weight. Your health care provider can help determine your BMI and help you achieve or maintain ahealthy weight. Get regular exercise Get regular exercise. This is one of the most important things you can do for your health. Most adults should: Exercise for at least 150 minutes each week. The exercise should increase your heart rate and make you sweat (moderate-intensity exercise). Do strengthening exercises at least twice a week. This is in addition to the moderate-intensity exercise. Spend less time sitting. Even light physical activity can be beneficial. Watch cholesterol and blood lipids Have your blood tested for lipids and cholesterol at 67 years of age, then havethis test every 5 years. You may need to have your cholesterol levels checked more often if: Your lipid or cholesterol levels are high. You are older than 67 years of age. You are at high risk for heart disease. What should I know about cancer screening? Many types of cancers can be detected early and may often be prevented. Depending on your health history and family history, you may need to have cancer screening at various ages. This may include screening for: Colorectal cancer. Prostate cancer. Skin cancer. Lung cancer. What should I know about heart disease, diabetes, and high blood pressure? Blood pressure and heart disease High blood pressure causes heart disease and increases the risk of stroke. This is more likely to develop in people who have high blood pressure readings, are of African descent, or are overweight. Talk with your health care provider about your target blood pressure readings. Have your blood pressure checked: Every 3-5 years if you are  63-68 years of age. Every year if you are 77 years old or older. If you are between the ages of 15 and 16 and are a current or former smoker, ask your health care provider if you should have a one-time screening for abdominal aortic aneurysm (AAA). Diabetes Have regular diabetes screenings. This checks your fasting blood sugar level. Have the screening done: Once every three years after age 41 if you are at a normal weight and have a low risk for diabetes. More often and at a younger age if you are overweight or have a high risk for diabetes. What should I know about preventing infection? Hepatitis B If you have a higher risk for hepatitis B, you should be screened for this virus. Talk with your health care provider to find out if you are at risk forhepatitis B infection. Hepatitis C Blood testing is recommended for: Everyone born from [birth year] through 1967. Anyone with known risk factors for hepatitis C. Sexually transmitted infections (STIs) You should be screened each year for STIs, including gonorrhea and chlamydia, if: You are sexually active and are younger than 67 years of age. You are older than 67 years of age and your health care provider tells you that you are at risk for this type of infection. Your sexual activity has changed since you were last screened, and  you are at increased risk for chlamydia or gonorrhea. Ask your health care provider if you are at risk. Ask your health care provider about whether you are at high risk for HIV. Your health care provider may recommend a prescription medicine to help prevent HIV infection. If you choose to take medicine to prevent HIV, you should first get tested for HIV. You should then be tested every 3 months for as long as you are taking the medicine. Follow these instructions at home: Lifestyle Do not use any products that contain nicotine or tobacco, such as cigarettes, e-cigarettes, and chewing tobacco. If you need help quitting, ask your  health care provider. Do not use street drugs. Do not share needles. Ask your health care provider for help if you need support or information about quitting drugs. Alcohol use Do not drink alcohol if your health care provider tells you not to drink. If you drink alcohol: Limit how much you have to 0-2 drinks a day. Be aware of how much alcohol is in your drink. In the [place] one drink equals one 12 oz bottle of beer (355 mL), one 5 oz glass of wine (148 mL), or one 1 oz glass of hard liquor (44 mL). General instructions Schedule regular health, dental, and eye exams. Stay current with your vaccines. Tell your health care provider if: You often feel depressed. You have ever been abused or do not feel safe at home. Summary Adopting a healthy lifestyle and getting preventive care are important in promoting health and wellness. Follow your health care provider's instructions about healthy diet, exercising, and getting tested or screened for diseases. Follow your health care provider's instructions on monitoring your cholesterol and blood pressure. This information is not intended to replace advice given to you by your health care provider. Make sure you discuss any questions you have with your healthcare provider. Document Revised: 11/12/2018 Document Reviewed: 11/12/2018 Elsevier Patient Education  2022 [employer].

## 2021-06-20 NOTE — Progress Notes (Signed)
MEDICARE ANNUAL WELLNESS VISIT  06/20/2021  Subjective:  Nathaniel Montes is a 65 y.o. male patient of Nathaniel Reeve, DO who had a Medicare Annual Wellness Visit today. Nathaniel Montes is Retired and lives with their spouse and his son. he has 2 children. he reports that he is socially active and does interact with friends/family regularly. he is minimally physically active and enjoys camping.  Patient Care Team: Nathaniel Reeve, DO as PCP - General (Osteopathic Medicine)  Advanced Directives 06/20/2021  Does Patient Have a Medical Advance Directive? Yes  Type of Advance Directive Living will  Does patient want to make changes to medical advance directive? No - Patient declined    Hospital Utilization Over the Past 12 Months: # of hospitalizations or ER visits: 0 # of surgeries: 0  Review of Systems    Patient reports that his overall health is unchanged when compared to last year.  Review of Systems: History obtained from chart review and the patient  All other systems negative.  Pain Assessment Pain : No/denies pain Pain Score: 0-No pain     Current Medications & Allergies (verified) Allergies as of 06/20/2021   No Known Allergies      Medication List        Accurate as of June 20, 2021  9:34 AM. If you have any questions, ask your nurse or doctor.          BABY ASPIRIN PO Take 81 mg by mouth daily.   fluticasone 50 MCG/ACT nasal spray Commonly known as: FLONASE Place 2 sprays into both nostrils daily.   lisinopril-hydrochlorothiazide 20-25 MG tablet Commonly known as: ZESTORETIC Take 1 tablet by mouth daily.   naproxen sodium 220 MG tablet Commonly known as: ALEVE Take 220 mg by mouth daily as needed.   PARoxetine 20 MG tablet Commonly known as: PAXIL Take 1 tablet (20 mg total) by mouth daily.   simvastatin 80 MG tablet Commonly known as: ZOCOR Take 1 tablet (80 mg total) by mouth daily.        History (reviewed): Past Medical History:   Diagnosis Date   Anxiety 11/17/2012   HYPERLIPIDEMIA 11/29/2009   HYPERTENSION 11/29/2009   Psoriasis 11/17/2012   Past Surgical History:  Procedure Laterality Date   APPENDECTOMY     COLONOSCOPY  08/16/2010   2008   LEG SURGERY Left    broken bone left lower leg, repaired   meniscus left knee Left    repair   POLYPECTOMY     REPLACEMENT TOTAL KNEE Right 12/2018   SHOULDER SURGERY Right    torn bicep, rotator cuff, ligament work injury   Family History  Problem Relation Age of Onset   Hypertension Father    Healthy Sister    Healthy Brother    Healthy Sister    Colon cancer Neg Hx    Colon polyps Neg Hx    Esophageal cancer Neg Hx    Rectal cancer Neg Hx    Stomach cancer Neg Hx    Social History   Socioeconomic History   Marital status: Married    Spouse name: Nathaniel Montes   Number of children: 2   Years of education: 12   Highest education level: 12th grade  Occupational History    Comment: Retired  Tobacco Use   Smoking status: Former    Types: Cigarettes    Quit date: 1996    Years since quitting: 26.5   Smokeless tobacco: Never  Vaping Use   Vaping Use: Never used  Substance and Sexual Activity   Alcohol use: Yes    Comment: 2 case of beer per week   Drug use: No   Sexual activity: Not on file  Other Topics Concern   Not on file  Social History Narrative   Lives with wife and his youngest son. He hasn't been able to do much exercise lately. He is starting to go camping soon.   Social Determinants of Health   Financial Resource Strain: Low Risk    Difficulty of Paying Living Expenses: Not hard at all  Food Insecurity: No Food Insecurity   Worried About Charity fundraiser in the Last Year: Never true   [place] in the Last Year: Never true  Transportation Needs: No Transportation Needs   Lack of Transportation (Medical): No   Lack of Transportation (Non-Medical): No  Physical Activity: Inactive   Days of Exercise per Week: 0 days    Minutes of Exercise per Session: 0 min  Stress: No Stress Concern Present   Feeling of Stress : Not at all  Social Connections: Moderately Isolated   Frequency of Communication with Friends and Family: More than three times a week   Frequency of Social Gatherings with Friends and Family: Twice a week   Attends Religious Services: Never   Marine scientist or Organizations: No   Attends Archivist Meetings: Never   Marital Status: Married    Activities of Daily Living In your present state of health, do you have any difficulty performing the following activities: 06/20/2021  Hearing? Y  Comment hx of an injury to the ears  Vision? N  Difficulty concentrating or making decisions? N  Walking or climbing stairs? Y  Comment due to a hx of knee replacement (right knee)  Dressing or bathing? N  Doing errands, shopping? N  Preparing Food and eating ? N  Using the Toilet? N  In the past six months, have you accidently leaked urine? N  Do you have problems with loss of bowel control? N  Managing your Medications? N  Managing your Finances? N  Housekeeping or managing your Housekeeping? N  Some recent data might be hidden    Patient Education/Literacy How often do you need to have someone help you when you read instructions, pamphlets, or other written materials from your doctor or pharmacy?: 1 - Never What is the last grade level you completed in school?: 12th grade  Exercise Current Exercise Habits: The patient does not participate in regular exercise at present, Exercise limited by: None identified  Diet Patient reports consuming 1 meals a day and 1 snack(s) a day Patient reports that his primary diet is: Regular Patient reports that she does have regular access to food.   Depression Screen PHQ 2/9 Scores 06/20/2021 10/20/2019 12/04/2018 04/17/2018 10/17/2017 10/17/2017 10/18/2016  PHQ - 2 Score 0 0 0 0 0 0 0  PHQ- 9 Score - - - 0 - - 0     Fall Risk Fall Risk   06/20/2021  Falls in the past year? 0  Number falls in past yr: 0  Injury with Fall? 0  Risk for fall due to : No Fall Risks  Follow up Falls evaluation completed     Objective:   BP (!) 146/66 (BP Location: Right Arm, Patient Position: Sitting, Cuff Size: Large)   Pulse 68   Ht 5\' 10"  (1.778 m)   Wt 295 lb (133.8 kg)   SpO2 96%   BMI  42.33 kg/m   Last Weight  Most recent update: 06/20/2021  9:01 AM    Weight  133.8 kg (295 lb)             Body mass index is 42.33 kg/m.  Hearing/Vision  Aadin did not have difficulty with hearing/understanding during the face-to-face interview Braden did not have difficulty with his vision during the face-to-face interview Reports that he has had a formal eye exam by an eye care professional within the past year Reports that he has not had a formal hearing evaluation within the past year  Cognitive Function: 6CIT Screen 06/20/2021  What Year? 0 points  What month? 0 points  What time? 0 points  Count back from 20 0 points  Months in reverse 0 points  Repeat phrase 0 points  Total Score 0    Normal Cognitive Function Screening: Yes (Normal:0-7, Significant for Dysfunction: >8)  Immunization & Health Maintenance Record Immunization History  Administered Date(s) Administered   Influenza,inj,Quad PF,6+ Mos 10/17/2017, 12/04/2018, 10/20/2019   Influenza-Unspecified 10/06/2020   Moderna Sars-Covid-2 Vaccination 01/16/2020, 02/13/2020, 10/31/2020   PNEUMOCOCCAL CONJUGATE-20 06/20/2021   Pneumococcal Polysaccharide-23 10/23/2019   Td 02/04/2007   Tdap 10/17/2017   Zoster Recombinat (Shingrix) 10/23/2019, 12/24/2019    Health Maintenance  Topic Date Due   COVID-19 Vaccine (4 - Booster for Moderna series) 07/06/2021 (Originally 02/28/2021)   INFLUENZA VACCINE  07/03/2021   PNA vac Low Risk Adult (2 of 2 - PCV13) 06/20/2022   COLONOSCOPY (Pts 45-48yrs Insurance coverage will need to be confirmed)  11/16/2022   TETANUS/TDAP   10/18/2027   Zoster Vaccines- Shingrix  Completed   HPV VACCINES  Aged Out   Hepatitis C Screening  Discontinued       Assessment  This is a routine wellness examination for [employer].  Health Maintenance: Due or Overdue There are no preventive care reminders to display for this patient.  [place] Pu does not need a referral for Community Assistance: Care Management:   no Social Work:    no Prescription Assistance:  no Nutrition/Diabetes Education:  no   Plan:  Personalized Goals  Goals Addressed               This Visit's Progress     Patient Stated (pt-stated)        06/20/2021 AWV Goal: Exercise for General Health  Patient will verbalize understanding of the benefits of increased physical activity: Exercising regularly is important. It will improve your overall fitness, flexibility, and endurance. Regular exercise also will improve your overall health. It can help you control your weight, reduce stress, and improve your bone density. Over the next year, patient will increase physical activity as tolerated with a goal of at least 150 minutes of moderate physical activity per week.  You can tell that you are exercising at a moderate intensity if your heart starts beating faster and you start breathing faster but can still hold a conversation. Moderate-intensity exercise ideas include: Walking 1 mile (1.6 km) in about 15 minutes Biking Hiking Golfing Dancing Water aerobics Patient will verbalize understanding of everyday activities that increase physical activity by providing examples like the following: Yard work, such as: Sales promotion account executive Gardening Washing windows or floors Patient will be able to explain general safety guidelines for exercising:  Before you start a new exercise program, talk with your health care provider. Do not exercise so much that  you hurt yourself,  feel dizzy, or get very short of breath. Wear comfortable clothes and wear shoes with good support. Drink plenty of water while you exercise to prevent dehydration or heat stroke. Work out until your breathing and your heartbeat get faster.        Personalized Health Maintenance & Screening Recommendations  Pneumococcal vaccine   Lung Cancer Screening Recommended: no (Low Dose CT Chest recommended if Age 16-80 years, 30 pack-year currently smoking OR have quit w/in past 15 years) Hepatitis C Screening recommended: no HIV Screening recommended: no  Advanced Directives: Written information was not given per the patient's request.  Referrals & Orders Orders Placed This Encounter  Procedures   Pneumococcal conjugate vaccine 20-valent (Prevnar 20)     Follow-up Plan Follow-up with Nathaniel Reeve, DO as planned Medicare wellness visit in one year.  AVS printed and given to the patient.   I have personally reviewed and noted the following in the patient's chart:   Medical and social history Use of alcohol, tobacco or illicit drugs  Current medications and supplements Functional ability and status Nutritional status Physical activity Advanced directives List of other physicians Hospitalizations, surgeries, and ER visits in previous 12 months Vitals Screenings to include cognitive, depression, and falls Referrals and appointments  In addition, I have reviewed and discussed with patient certain preventive protocols, quality metrics, and best practice recommendations. A written personalized care plan for preventive services as well as general preventive health recommendations were provided to patient.     Tinnie Gens, RN  06/20/2021

## 2021-06-29 ENCOUNTER — Encounter: Payer: Self-pay | Admitting: Osteopathic Medicine

## 2021-06-29 ENCOUNTER — Other Ambulatory Visit: Payer: Self-pay

## 2021-06-29 ENCOUNTER — Ambulatory Visit (INDEPENDENT_AMBULATORY_CARE_PROVIDER_SITE_OTHER): Payer: Medicare HMO | Admitting: Osteopathic Medicine

## 2021-06-29 VITALS — BP 140/69 | HR 73 | Temp 97.9°F | Ht 70.0 in | Wt 292.0 lb

## 2021-06-29 DIAGNOSIS — I10 Essential (primary) hypertension: Secondary | ICD-10-CM

## 2021-06-29 DIAGNOSIS — E785 Hyperlipidemia, unspecified: Secondary | ICD-10-CM | POA: Diagnosis not present

## 2021-06-29 DIAGNOSIS — D649 Anemia, unspecified: Secondary | ICD-10-CM | POA: Diagnosis not present

## 2021-06-29 DIAGNOSIS — R7303 Prediabetes: Secondary | ICD-10-CM | POA: Diagnosis not present

## 2021-06-29 DIAGNOSIS — L409 Psoriasis, unspecified: Secondary | ICD-10-CM

## 2021-06-29 DIAGNOSIS — Z125 Encounter for screening for malignant neoplasm of prostate: Secondary | ICD-10-CM | POA: Diagnosis not present

## 2021-06-29 MED ORDER — ROPINIROLE HCL 0.5 MG PO TABS: 0.2500 mg | ORAL_TABLET | Freq: Every evening | ORAL | 0 refills | Status: DC | PRN

## 2021-06-29 NOTE — Progress Notes (Signed)
Nathaniel Montes is a 65 y.o. male who presents to  [place] at [facility]  today, 06/29/21, seeking care for the following:  Routine check-up chronic conditions, see below     [place] with other pertinent findings:  The primary encounter diagnosis was Essential hypertension. Diagnoses of Prediabetes, Psoriasis, and Hyperlipidemia with target low density lipoprotein (LDL) cholesterol less than 130 mg/dL were also pertinent to this visit.   Chronic conditions stable  SBP up a bit from previous, pt to work on weight loss     Orders Placed This Encounter  Procedures   CBC   COMPLETE METABOLIC PANEL WITH GFR   Lipid panel   Hemoglobin A1c   PSA, Total with Reflex to PSA, Free   TSH   Fe+TIBC+Fer     Meds ordered this encounter  Medications   rOPINIRole (REQUIP) 0.5 MG tablet    Sig: Take 0.5-1 tablets (0.25-0.5 mg total) by mouth at bedtime as needed (restless legs).    Dispense:  30 tablet    Refill:  0      See below for relevant physical exam findings  See below for recent lab and imaging results reviewed  Medications, allergies, PMH, PSH, SocH, FamH reviewed below    Follow-up instructions: Return in about 1 year (around 06/29/2022) for CALL us TO ARRANGE ROUTINE CHECK-UP .                                        Exam:  BP 140/69   Pulse 73   Temp 97.9 F (36.6 C)   Ht '5\' 10"'  (1.778 m)   Wt 292 lb (132.5 kg)   SpO2 95%   BMI 41.90 kg/m  Constitutional: VS see above. General Appearance: alert, well-developed, well-nourished, NAD Neck: No masses, trachea midline.  Respiratory: Normal respiratory effort. no wheeze, no rhonchi, no rales Cardiovascular: S1/S2 normal, no murmur, no rub/gallop auscultated. RRR.  Musculoskeletal: Gait normal. Symmetric and independent movement of all extremities Abdominal: non-tender, non-distended, no appreciable organomegaly, neg  Murphy's, BS [id number] Neurological: Normal balance/coordination. No tremor. Skin: warm, dry, intact.  Psychiatric: Normal judgment/insight. Normal mood and affect. Oriented x3.   Current Meds  Medication Sig   BABY ASPIRIN PO Take 81 mg by mouth daily.   naproxen sodium (ALEVE) 220 MG tablet Take 220 mg by mouth daily as needed.   rOPINIRole (REQUIP) 0.5 MG tablet Take 0.5-1 tablets (0.25-0.5 mg total) by mouth at bedtime as needed (restless legs).   simvastatin (ZOCOR) 80 MG tablet Take 1 tablet (80 mg total) by mouth daily.   [DISCONTINUED] lisinopril-hydrochlorothiazide (ZESTORETIC) 20-25 MG tablet Take 1 tablet by mouth daily.   [DISCONTINUED] PARoxetine (PAXIL) 20 MG tablet Take 1 tablet (20 mg total) by mouth daily.    No Known Allergies  Patient Active Problem List   Diagnosis Date Noted   Primary osteoarthritis of right knee 04/18/2018   Osteoarthritis of both hands 07/22/2014   History of adenomatous polyp of colon 01/21/2013   Prediabetes 12/19/2012   Anxiety 11/17/2012   Psoriasis 11/17/2012   Hyperlipidemia with target low density lipoprotein (LDL) cholesterol less than 130 mg/dL 11/29/2009   Essential hypertension 11/29/2009    Family History  Problem Relation Age of Onset   Hypertension Father    Healthy Sister    Healthy Brother    Healthy Sister  Colon cancer Neg Hx    Colon polyps Neg Hx    Esophageal cancer Neg Hx    Rectal cancer Neg Hx    Stomach cancer Neg Hx     Social History   Tobacco Use  Smoking Status Former   Types: Cigarettes   Quit date: 1996   Years since quitting: 26.6  Smokeless Tobacco Never    Past Surgical History:  Procedure Laterality Date   APPENDECTOMY     COLONOSCOPY  08/16/2010   2008   LEG SURGERY Left    broken bone left lower leg, repaired   meniscus left knee Left    repair   POLYPECTOMY     REPLACEMENT TOTAL KNEE Right 12/2018   SHOULDER SURGERY Right    torn bicep, rotator cuff, ligament work injury     Immunization History  Administered Date(s) Administered   Influenza,inj,Quad PF,6+ Mos 10/17/2017, 12/04/2018, 10/20/2019   Influenza-Unspecified 10/06/2020   Moderna Sars-Covid-2 Vaccination 01/16/2020, 02/13/2020, 10/31/2020   PNEUMOCOCCAL CONJUGATE-20 06/20/2021   Pneumococcal Polysaccharide-23 10/23/2019   Td 02/04/2007   Tdap 10/17/2017   Zoster Recombinat (Shingrix) 10/23/2019, 12/24/2019    Recent Results (from the past 2160 hour(s))  CBC     Status: Abnormal   Collection Time: 06/29/21 12:00 AM  Result Value Ref Range   WBC 7.9 3.8 - 10.8 Thousand/uL   RBC 4.17 (L) 4.20 - 5.80 Million/uL   Hemoglobin 14.0 13.2 - 17.1 g/dL   HCT 41.1 38.5 - 50.0 %   MCV 98.6 80.0 - 100.0 fL   MCH 33.6 (H) 27.0 - 33.0 pg   MCHC 34.1 32.0 - 36.0 g/dL   RDW 12.2 11.0 - 15.0 %   Platelets 304 140 - 400 Thousand/uL   MPV 10.4 7.5 - 12.5 fL  COMPLETE METABOLIC PANEL WITH GFR     Status: Abnormal   Collection Time: 06/29/21 12:00 AM  Result Value Ref Range   Glucose, Bld 116 (H) 65 - 99 mg/dL    Comment: .            Fasting reference interval . For someone without known diabetes, a glucose value between 100 and 125 mg/dL is consistent with prediabetes and should be confirmed with a follow-up test. .    BUN 18 7 - 25 mg/dL   Creat 1.15 0.70 - 1.35 mg/dL   eGFR 70 > OR = 60 mL/min/1.2m    Comment: The eGFR is based on the CKD-EPI 2021 equation. To calculate  the new eGFR from a previous Creatinine or Cystatin C result, go to https://www.kidney.org/professionals/ kdoqi/gfr%5Fcalculator    BUN/Creatinine Ratio NOT APPLICABLE 6 - 22 (calc)   Sodium 134 (L) 135 - 146 mmol/L   Potassium 4.9 3.5 - 5.3 mmol/L   Chloride 98 98 - 110 mmol/L   CO2 27 20 - 32 mmol/L   Calcium 8.9 8.6 - 10.3 mg/dL   Total Protein 6.8 6.1 - 8.1 g/dL   Albumin 4.2 3.6 - 5.1 g/dL   Globulin 2.6 1.9 - 3.7 g/dL (calc)   AG Ratio 1.6 1.0 - 2.5 (calc)   Total Bilirubin 0.8 0.2 - 1.2 mg/dL   Alkaline  phosphatase (APISO) 59 35 - 144 U/L   AST 37 (H) 10 - 35 U/L   ALT 42 9 - 46 U/L  Lipid panel     Status: None   Collection Time: 06/29/21 12:00 AM  Result Value Ref Range   Cholesterol 119 <200 mg/dL   HDL 45 > OR =  40 mg/dL   Triglycerides 83 <150 mg/dL   LDL Cholesterol (Calc) 57 mg/dL (calc)    Comment: Reference range: <100 . Desirable range <100 mg/dL for primary prevention;   <70 mg/dL for patients with CHD or diabetic patients  with > or = 2 CHD risk factors. Marland Kitchen LDL-C is now calculated using the Martin-Hopkins  calculation, which is a validated novel method providing  better accuracy than the Friedewald equation in the  estimation of LDL-C.  Cresenciano Genre et al. Annamaria Helling. 7867;544(92): 2061-2068  (http://education.[facility].com/faq/FAQ164)    Total CHOL/HDL Ratio 2.6 <5.0 (calc)   Non-HDL Cholesterol (Calc) 74 <130 mg/dL (calc)    Comment: For patients with diabetes plus 1 major ASCVD risk  factor, treating to a non-HDL-C goal of <100 mg/dL  (LDL-C of <70 mg/dL) is considered a therapeutic  option.   Hemoglobin A1c     Status: Abnormal   Collection Time: 06/29/21 12:00 AM  Result Value Ref Range   Hgb A1c MFr Bld 6.1 (H) <5.7 % of total Hgb    Comment: For someone without known diabetes, a hemoglobin  A1c value between 5.7% and 6.4% is consistent with prediabetes and should be confirmed with a  follow-up test. . For someone with known diabetes, a value <7% indicates that their diabetes is well controlled. A1c targets should be individualized based on duration of diabetes, age, comorbid conditions, and other considerations. . This assay result is consistent with an increased risk of diabetes. . Currently, no consensus exists regarding use of hemoglobin A1c for diagnosis of diabetes for children. .    Mean Plasma Glucose 128 mg/dL   eAG (mmol/L) 7.1 mmol/L  PSA, Total with Reflex to PSA, Free     Status: None   Collection Time: 06/29/21 12:00 AM  Result  Value Ref Range   PSA, Total 3.0 < OR = 4.0 ng/mL    Comment: The Total PSA value from this assay system is  standardized against the equimolar PSA standard.  The test result will be approximately 20% higher  when compared to the [facility] Total PSA  (Siemens assay). Comparison of serial PSA results  should be interpreted with this fact in mind. Marland Kitchen PSA was performed using the Beckman Coulter  Immunoassay method. Values obtained from different  assay methods cannot be used interchangeably. PSA  levels, regardless of value, should not be  interpreted as absolute evidence of the presence or  absence of disease.   TSH     Status: None   Collection Time: 06/29/21 12:00 AM  Result Value Ref Range   TSH 1.38 0.40 - 4.50 mIU/L  Fe+TIBC+Fer     Status: None   Collection Time: 06/29/21 12:00 AM  Result Value Ref Range   Iron 95 50 - 180 mcg/dL   TIBC 313 250 - 425 mcg/dL (calc)   %SAT 30 20 - 48 % (calc)   Ferritin 265 24 - 380 ng/mL    No results found.     All questions at time of visit were answered - patient instructed to contact office with any additional concerns or updates. ER/RTC precautions were reviewed with the patient as applicable.   Please note: manual typing as well as voice recognition software may have been used to produce this document - typos may escape review. Please contact Dr. Sheppard Coil for any needed clarifications.

## 2021-06-29 NOTE — Patient Instructions (Addendum)
Labs due!  As long as labs ok, can refill for a year   FYI to my patients: After six years here, I will be leaving practice at [facility]. My last day here will be 09/01/2021. I will continue to provide your care up until that date, and you will still be considered a patient here after that as long as you want to be!    After 09/01/2021, my patients have several options to continue care:  1) you can establish care with Dr. Luetta Nutting or Samuel Bouche NP, who are accepting new patients here and are absorbing many folks from my current patient panel, OR...  2) you can see any available provider here on as-needed basis until my official replacement starts (hiring a new doctor has not been finalized yet), OR.Marland KitchenMarland Kitchen 3) if you choose to seek care elsewhere, this office will be happy to facilitate transfer of records, and will refill medications on a case-by-case basis.   It is bittersweet to leave! 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 I have truly enjoyed taking care of folks here, but I am also excited for my next adventure doing something a bit different. Take care, and please let us know if you have any questions!   -Dr. Loni Muse.

## 2021-06-30 LAB — LIPID PANEL
Cholesterol: 119 mg/dL (ref <200)
HDL: 45 mg/dL (ref >=40)
LDL Cholesterol (Calc): 57 mg/dL (calc)
Non-HDL Cholesterol (Calc): 74 mg/dL (calc) (ref <130)
Total CHOL/HDL Ratio: 2.6 (calc) (ref <5.0)
Triglycerides: 83 mg/dL (ref <150)

## 2021-06-30 LAB — TSH: TSH: 1.38 mIU/L (ref 0.40–4.50)

## 2021-06-30 LAB — PSA, TOTAL WITH REFLEX TO PSA, FREE: PSA, Total: 3 ng/mL (ref <=4.0)

## 2021-06-30 LAB — COMPLETE METABOLIC PANEL WITH GFR
AG Ratio: 1.6 (calc) (ref 1.0–2.5)
ALT: 42 U/L (ref 9–46)
AST: 37 U/L — ABNORMAL HIGH (ref 10–35)
Albumin: 4.2 g/dL (ref 3.6–5.1)
Alkaline phosphatase (APISO): 59 U/L (ref 35–144)
BUN: 18 mg/dL (ref 7–25)
CO2: 27 mmol/L (ref 20–32)
Calcium: 8.9 mg/dL (ref 8.6–10.3)
Chloride: 98 mmol/L (ref 98–110)
Creat: 1.15 mg/dL (ref 0.70–1.35)
Globulin: 2.6 g/dL (calc) (ref 1.9–3.7)
Glucose, Bld: 116 mg/dL — ABNORMAL HIGH (ref 65–99)
Potassium: 4.9 mmol/L (ref 3.5–5.3)
Sodium: 134 mmol/L — ABNORMAL LOW (ref 135–146)
Total Bilirubin: 0.8 mg/dL (ref 0.2–1.2)
Total Protein: 6.8 g/dL (ref 6.1–8.1)
eGFR: 70 mL/min/{1.73_m2} (ref >=60)

## 2021-06-30 LAB — CBC
HCT: 41.1 % (ref 38.5–50.0)
Hemoglobin: 14 g/dL (ref 13.2–17.1)
MCH: 33.6 pg — ABNORMAL HIGH (ref 27.0–33.0)
MCHC: 34.1 g/dL (ref 32.0–36.0)
MCV: 98.6 fL (ref 80.0–100.0)
MPV: 10.4 fL (ref 7.5–12.5)
Platelets: 304 10*3/uL (ref 140–400)
RBC: 4.17 10*6/uL — ABNORMAL LOW (ref 4.20–5.80)
RDW: 12.2 % (ref 11.0–15.0)
WBC: 7.9 10*3/uL (ref 3.8–10.8)

## 2021-06-30 LAB — IRON,TIBC AND FERRITIN PANEL
%SAT: 30 % (calc) (ref 20–48)
Ferritin: 265 ng/mL (ref 24–380)
Iron: 95 ug/dL (ref 50–180)
TIBC: 313 mcg/dL (calc) (ref 250–425)

## 2021-06-30 LAB — HEMOGLOBIN A1C
Hgb A1c MFr Bld: 6.1 % of total Hgb — ABNORMAL HIGH (ref <5.7)
Mean Plasma Glucose: 128 mg/dL
eAG (mmol/L): 7.1 mmol/L

## 2021-07-01 ENCOUNTER — Other Ambulatory Visit: Payer: Self-pay | Admitting: Osteopathic Medicine

## 2021-07-01 DIAGNOSIS — F419 Anxiety disorder, unspecified: Secondary | ICD-10-CM

## 2021-07-01 DIAGNOSIS — I10 Essential (primary) hypertension: Secondary | ICD-10-CM

## 2021-07-21 ENCOUNTER — Other Ambulatory Visit: Payer: Self-pay | Admitting: Osteopathic Medicine

## 2021-09-12 ENCOUNTER — Ambulatory Visit (INDEPENDENT_AMBULATORY_CARE_PROVIDER_SITE_OTHER): Payer: Medicare HMO | Admitting: Family Medicine

## 2021-09-12 ENCOUNTER — Ambulatory Visit (INDEPENDENT_AMBULATORY_CARE_PROVIDER_SITE_OTHER): Payer: Medicare HMO

## 2021-09-12 ENCOUNTER — Encounter: Payer: Self-pay | Admitting: Family Medicine

## 2021-09-12 ENCOUNTER — Other Ambulatory Visit: Payer: Self-pay

## 2021-09-12 VITALS — BP 159/73 | HR 78 | Ht 70.0 in | Wt 295.0 lb

## 2021-09-12 DIAGNOSIS — G8929 Other chronic pain: Secondary | ICD-10-CM | POA: Diagnosis not present

## 2021-09-12 DIAGNOSIS — M25532 Pain in left wrist: Secondary | ICD-10-CM

## 2021-09-12 MED ORDER — MELOXICAM 15 MG PO TABS
15.0000 mg | ORAL_TABLET | Freq: Every day | ORAL | 0 refills | Status: DC
Start: 2021-09-12 — End: 2021-10-10

## 2021-09-12 NOTE — Progress Notes (Signed)
Acute Office Visit  Subjective:    Patient ID: Nathaniel Montes, male    DOB: Apr 20, 1956, 65 y.o.   MRN: [id number]  Chief Complaint  Patient presents with   Joint Swelling    Wrist Pain  The pain is present in the left wrist. This is a new problem. The current episode started 1 to 4 weeks ago. There has been a history of trauma. The problem occurs constantly. The pain is at a severity of 6/10. The pain is moderate. Associated symptoms include joint swelling, a limited range of motion and stiffness. Pertinent negatives include no tingling. He has tried NSAIDS and OTC ointments for the symptoms. The treatment provided mild relief. His past medical history is significant for osteoarthritis. There is no history of gout.  Has broken this rest at least twice, back in his 20's. States it has mild swelling occasionally, but has never gotten this big or lasted this long. Denies any warmth, erythema, numbness, tingling,      Past Medical History:  Diagnosis Date   Anxiety 11/17/2012   HYPERLIPIDEMIA 11/29/2009   HYPERTENSION 11/29/2009   Psoriasis 11/17/2012    Past Surgical History:  Procedure Laterality Date   APPENDECTOMY     COLONOSCOPY  08/16/2010   2008   LEG SURGERY Left    broken bone left lower leg, repaired   meniscus left knee Left    repair   POLYPECTOMY     REPLACEMENT TOTAL KNEE Right 12/2018   SHOULDER SURGERY Right    torn bicep, rotator cuff, ligament work injury    Family History  Problem Relation Age of Onset   Hypertension Father    Healthy Sister    Healthy Brother    Healthy Sister    Colon cancer Neg Hx    Colon polyps Neg Hx    Esophageal cancer Neg Hx    Rectal cancer Neg Hx    Stomach cancer Neg Hx     Social History   Socioeconomic History   Marital status: Married    Spouse name: Tracie   Number of children: 2   Years of education: 12   Highest education level: 12th grade  Occupational History    Comment: Retired  Tobacco Use    Smoking status: Former    Types: Cigarettes    Quit date: 1996    Years since quitting: 26.7   Smokeless tobacco: Never  Vaping Use   Vaping Use: Never used  Substance and Sexual Activity   Alcohol use: Yes    Comment: 2 case of beer per week   Drug use: No   Sexual activity: Not on file  Other Topics Concern   Not on file  Social History Narrative   Lives with wife and his youngest son. He hasn't been able to do much exercise lately. He is starting to go camping soon.   Social Determinants of Health   Financial Resource Strain: Low Risk    Difficulty of Paying Living Expenses: Not hard at all  Food Insecurity: No Food Insecurity   Worried About Charity fundraiser in the Last Year: Never true   [place] in the Last Year: Never true  Transportation Needs: No Transportation Needs   Lack of Transportation (Medical): No   Lack of Transportation (Non-Medical): No  Physical Activity: Inactive   Days of Exercise per Week: 0 days   Minutes of Exercise per Session: 0 min  Stress: No Stress Concern Present  Feeling of Stress : Not at all  Social Connections: Moderately Isolated   Frequency of Communication with Friends and Family: More than three times a week   Frequency of Social Gatherings with Friends and Family: Twice a week   Attends Religious Services: Never   Marine scientist or Organizations: No   Attends Music therapist: Never   Marital Status: Married  Human resources officer Violence: Not At Risk   Fear of Current or Ex-Partner: No   Emotionally Abused: No   Physically Abused: No   Sexually Abused: No    Outpatient Medications Prior to Visit  Medication Sig Dispense Refill   BABY ASPIRIN PO Take 81 mg by mouth daily.     fluticasone (FLONASE) 50 MCG/ACT nasal spray Place 2 sprays into both nostrils daily. 16 g 2   lisinopril-hydrochlorothiazide (ZESTORETIC) 20-25 MG tablet TAKE 1 TABLET BY MOUTH EVERY DAY 90 tablet 3   naproxen sodium (ALEVE)  220 MG tablet Take 220 mg by mouth daily as needed.     PARoxetine (PAXIL) 20 MG tablet TAKE 1 TABLET BY MOUTH EVERY DAY 90 tablet 3   rOPINIRole (REQUIP) 0.5 MG tablet TAKE 1/2 TO 1 TAB AT BEDTIME AS NEEDED FOR RESTLESS LEG 90 tablet 3   simvastatin (ZOCOR) 80 MG tablet Take 1 tablet (80 mg total) by mouth daily. 90 tablet 3   No facility-administered medications prior to visit.    No Known Allergies  Review of Systems All review of systems negative except what is listed in the HPI     Objective:    Physical Exam Vitals reviewed.  Constitutional:      Appearance: Normal appearance. He is obese.  Musculoskeletal:     Comments: L wrist swelling, decreased flexion and extension d/t pain, tender to palpation over entire wrist  Skin:    Capillary Refill: Capillary refill takes less than 2 seconds.     Findings: No erythema or rash.  Neurological:     General: No focal deficit present.     Mental Status: He is alert and oriented to person, place, and time. Mental status is at baseline.  Psychiatric:        Mood and Affect: Mood normal.        Behavior: Behavior normal.        Thought Content: Thought content normal.        Judgment: Judgment normal.        BP (!) 159/73   Pulse 78   Ht '5\' 10"'  (1.778 m)   Wt 295 lb (133.8 kg)   SpO2 96%   BMI 42.33 kg/m  Wt Readings from Last 3 Encounters:  09/12/21 295 lb (133.8 kg)  06/29/21 292 lb (132.5 kg)  06/20/21 295 lb (133.8 kg)    Health Maintenance Due  Topic Date Due   COVID-19 Vaccine (4 - Booster for Moderna series) 02/28/2021   INFLUENZA VACCINE  07/03/2021    There are no preventive care reminders to display for this patient.   Lab Results  Component Value Date   TSH 1.38 06/29/2021   Lab Results  Component Value Date   WBC 7.9 06/29/2021   HGB 14.0 06/29/2021   HCT 41.1 06/29/2021   MCV 98.6 06/29/2021   PLT 304 06/29/2021   Lab Results  Component Value Date   NA 134 (L) 06/29/2021   K 4.9  06/29/2021   CO2 27 06/29/2021   GLUCOSE 116 (H) 06/29/2021   BUN 18 06/29/2021  CREATININE 1.15 06/29/2021   BILITOT 0.8 06/29/2021   ALKPHOS 60 10/18/2016   AST 37 (H) 06/29/2021   ALT 42 06/29/2021   PROT 6.8 06/29/2021   ALBUMIN 4.2 10/18/2016   CALCIUM 8.9 06/29/2021   EGFR 70 06/29/2021   Lab Results  Component Value Date   CHOL 119 06/29/2021   Lab Results  Component Value Date   HDL 45 06/29/2021   Lab Results  Component Value Date   LDLCALC 57 06/29/2021   Lab Results  Component Value Date   TRIG 83 06/29/2021   Lab Results  Component Value Date   CHOLHDL 2.6 06/29/2021   Lab Results  Component Value Date   HGBA1C 6.1 (H) 06/29/2021       Assessment & Plan:   1. Left wrist pain Likely arthritis related. Xray today to confirm no other issues. Recommending rest, ice, compression (sleeve from drug store/[employer]), elevation, meloxicam, gentle stretching. Patient aware of signs/symptoms requiring further/urgent evaluation.  - meloxicam (MOBIC) 15 MG tablet; Take 1 tablet (15 mg total) by mouth daily.  Dispense: 30 tablet; Refill: 0 - DG Wrist Complete Left; Future  Follow-up in 4-6 weeks if not improving.   Purcell Nails Olevia Bowens, DNP, FNP-C

## 2021-09-12 NOTE — Patient Instructions (Signed)
Get a wrist sleeve from the drug store or [place] to apply some slight compression to help the swelling go down.

## 2021-09-15 ENCOUNTER — Other Ambulatory Visit: Payer: Self-pay | Admitting: Osteopathic Medicine

## 2021-09-15 DIAGNOSIS — E785 Hyperlipidemia, unspecified: Secondary | ICD-10-CM

## 2021-10-09 ENCOUNTER — Other Ambulatory Visit: Payer: Self-pay | Admitting: Family Medicine

## 2021-10-09 DIAGNOSIS — M25532 Pain in left wrist: Secondary | ICD-10-CM

## 2021-10-10 ENCOUNTER — Ambulatory Visit: Payer: Medicare HMO | Admitting: Family Medicine

## 2021-10-30 ENCOUNTER — Ambulatory Visit (INDEPENDENT_AMBULATORY_CARE_PROVIDER_SITE_OTHER): Payer: Medicare HMO | Admitting: Family Medicine

## 2021-10-30 ENCOUNTER — Encounter: Payer: Self-pay | Admitting: Family Medicine

## 2021-10-30 ENCOUNTER — Other Ambulatory Visit: Payer: Self-pay

## 2021-10-30 VITALS — BP 122/69 | HR 101

## 2021-10-30 DIAGNOSIS — M25532 Pain in left wrist: Secondary | ICD-10-CM

## 2021-10-30 DIAGNOSIS — L408 Other psoriasis: Secondary | ICD-10-CM | POA: Diagnosis not present

## 2021-10-30 DIAGNOSIS — Z23 Encounter for immunization: Secondary | ICD-10-CM | POA: Diagnosis not present

## 2021-10-30 DIAGNOSIS — Z79899 Other long term (current) drug therapy: Secondary | ICD-10-CM | POA: Diagnosis not present

## 2021-10-30 MED ORDER — MELOXICAM 15 MG PO TABS: 15.0000 mg | ORAL_TABLET | Freq: Every day | ORAL | 1 refills | Status: DC

## 2021-10-30 NOTE — Patient Instructions (Signed)
Continue meloxicam, stretches, compression.  Start physical therapy for your hand Follow-up with Dr. Darene Lamer in 4-6 weeks if not improving. Consider injections.

## 2021-10-30 NOTE — Progress Notes (Signed)
Acute Office Visit  Subjective:    Patient ID: Nathaniel Montes, male    DOB: [birth year], 65 y.o.   MRN: [id number]  Chief Complaint  Patient presents with   Osteoarthritis    HPI Patient is in today for follow-up left wrist/hand   Nathaniel Montes was seen 09/12/21 for worsening left wrist pain. Xrays at that visit showed some MCP/CMC arthritic changes. He was started on Meloxicam and encouraged to try rest, ice, compression sleeve, gentle stretches. He is back today stating he has not had much improvement. States at rest pain is 0, but with movements, he can get up to 7/10 - most triggering movements are gripping and wrist supination. States most of his pain is in wrist and PIP joints of all 4 fingers, no significant thumb discomfort. He continues to have mild-moderate swelling to hand, but may be slightly improved since using the compression sleeve. He has not had any relief from topical analgesics. He denies any worsening edema, erythema, warmth, rashes.     Past Medical History:  Diagnosis Date   Anxiety 11/17/2012   HYPERLIPIDEMIA 11/29/2009   HYPERTENSION 11/29/2009   Psoriasis 11/17/2012    Past Surgical History:  Procedure Laterality Date   APPENDECTOMY     COLONOSCOPY  08/16/2010   2008   LEG SURGERY Left    broken bone left lower leg, repaired   meniscus left knee Left    repair   POLYPECTOMY     REPLACEMENT TOTAL KNEE Right 12/2018   SHOULDER SURGERY Right    torn bicep, rotator cuff, ligament work injury    Family History  Problem Relation Age of Onset   Hypertension Father    Healthy Sister    Healthy Brother    Healthy Sister    Colon cancer Neg Hx    Colon polyps Neg Hx    Esophageal cancer Neg Hx    Rectal cancer Neg Hx    Stomach cancer Neg Hx     Social History   Socioeconomic History   Marital status: Married    Spouse name: Nathaniel Montes   Number of children: 2   Years of education: 12   Highest education level: 12th grade  Occupational History     Comment: Retired  Tobacco Use   Smoking status: Former    Types: Cigarettes    Quit date: 1996    Years since quitting: 26.9   Smokeless tobacco: Never  Vaping Use   Vaping Use: Never used  Substance and Sexual Activity   Alcohol use: Yes    Comment: 2 case of beer per week   Drug use: No   Sexual activity: Not on file  Other Topics Concern   Not on file  Social History Narrative   Lives with wife and his youngest son. He hasn't been able to do much exercise lately. He is starting to go camping soon.   Social Determinants of Health   Financial Resource Strain: Low Risk    Difficulty of Paying Living Expenses: Not hard at all  Food Insecurity: No Food Insecurity   Worried About Charity fundraiser in the Last Year: Never true   [place] in the Last Year: Never true  Transportation Needs: No Transportation Needs   Lack of Transportation (Medical): No   Lack of Transportation (Non-Medical): No  Physical Activity: Inactive   Days of Exercise per Week: 0 days   Minutes of Exercise per Session: 0 min  Stress: No Stress Concern  Present   Feeling of Stress : Not at all  Social Connections: Moderately Isolated   Frequency of Communication with Friends and Family: More than three times a week   Frequency of Social Gatherings with Friends and Family: Twice a week   Attends Religious Services: Never   Marine scientist or Organizations: No   Attends Music therapist: Never   Marital Status: Married  Human resources officer Violence: Not At Risk   Fear of Current or Ex-Partner: No   Emotionally Abused: No   Physically Abused: No   Sexually Abused: No    Outpatient Medications Prior to Visit  Medication Sig Dispense Refill   BABY ASPIRIN PO Take 81 mg by mouth daily.     fluticasone (FLONASE) 50 MCG/ACT nasal spray Place 2 sprays into both nostrils daily. 16 g 2   lisinopril-hydrochlorothiazide (ZESTORETIC) 20-25 MG tablet TAKE 1 TABLET BY MOUTH EVERY DAY 90  tablet 3   naproxen sodium (ALEVE) 220 MG tablet Take 220 mg by mouth daily as needed.     PARoxetine (PAXIL) 20 MG tablet TAKE 1 TABLET BY MOUTH EVERY DAY 90 tablet 3   rOPINIRole (REQUIP) 0.5 MG tablet TAKE 1/2 TO 1 TAB AT BEDTIME AS NEEDED FOR RESTLESS LEG 90 tablet 3   simvastatin (ZOCOR) 80 MG tablet TAKE 1 TABLET BY MOUTH EVERY DAY 90 tablet 2   meloxicam (MOBIC) 15 MG tablet TAKE 1 TABLET (15 MG TOTAL) BY MOUTH DAILY. 30 tablet 0   No facility-administered medications prior to visit.    No Known Allergies  Review of Systems All review of systems negative except what is listed in the HPI     Objective:    Physical Exam Vitals reviewed.  Constitutional:      Appearance: Normal appearance.  Musculoskeletal:        General: Swelling and tenderness present.     Comments: Left hand/wrist generalized edema; no erythema, warmth, fluctuance/induration; unable to fully grip and pain with supination  Skin:    Findings: No bruising, erythema, lesion or rash.  Neurological:     General: No focal deficit present.     Mental Status: He is alert and oriented to person, place, and time. Mental status is at baseline.  Psychiatric:        Mood and Affect: Mood normal.        Behavior: Behavior normal.        Thought Content: Thought content normal.        Judgment: Judgment normal.    BP 122/69   Pulse (!) 101   SpO2 95%  Wt Readings from Last 3 Encounters:  09/12/21 295 lb (133.8 kg)  06/29/21 292 lb (132.5 kg)  06/20/21 295 lb (133.8 kg)    Health Maintenance Due  Topic Date Due   COVID-19 Vaccine (4 - Booster for Moderna series) 12/26/2020   INFLUENZA VACCINE  07/03/2021    There are no preventive care reminders to display for this patient.   Lab Results  Component Value Date   TSH 1.38 06/29/2021   Lab Results  Component Value Date   WBC 7.9 06/29/2021   HGB 14.0 06/29/2021   HCT 41.1 06/29/2021   MCV 98.6 06/29/2021   PLT 304 06/29/2021   Lab Results   Component Value Date   NA 134 (L) 06/29/2021   K 4.9 06/29/2021   CO2 27 06/29/2021   GLUCOSE 116 (H) 06/29/2021   BUN 18 06/29/2021   CREATININE 1.15 06/29/2021  BILITOT 0.8 06/29/2021   ALKPHOS 60 10/18/2016   AST 37 (H) 06/29/2021   ALT 42 06/29/2021   PROT 6.8 06/29/2021   ALBUMIN 4.2 10/18/2016   CALCIUM 8.9 06/29/2021   EGFR 70 06/29/2021   Lab Results  Component Value Date   CHOL 119 06/29/2021   Lab Results  Component Value Date   HDL 45 06/29/2021   Lab Results  Component Value Date   LDLCALC 57 06/29/2021   Lab Results  Component Value Date   TRIG 83 06/29/2021   Lab Results  Component Value Date   CHOLHDL 2.6 06/29/2021   Lab Results  Component Value Date   HGBA1C 6.1 (H) 06/29/2021       Assessment & Plan:   1. Left wrist pain Discussed case with Dr. Dianah Field. Will try a few more weeks of conservative management and add formal physical therapy. If no improvement, consider injections with Dr. Darene Lamer.   - Ambulatory referral to Physical Therapy - meloxicam (MOBIC) 15 MG tablet; Take 1 tablet (15 mg total) by mouth daily.  Dispense: 30 tablet; Refill: 1  2. Flu vaccine need - Flu Vaccine QUAD High Dose(Fluad)   Follow-up with sports medicine in 4-6 weeks if not improving with PT and supportive measures.    Terrilyn Saver, NP

## 2021-11-28 ENCOUNTER — Ambulatory Visit: Payer: Medicare HMO | Admitting: Sports Medicine

## 2022-02-18 IMAGING — DX DG WRIST COMPLETE 3+V*L*
4 series · 4 of 4 positions shown · non-contrast
Comparison: None.

CLINICAL DATA: Chronic left wrist pain

EXAM:
LEFT WRIST - COMPLETE 3+ VIEW

[wrist pa]
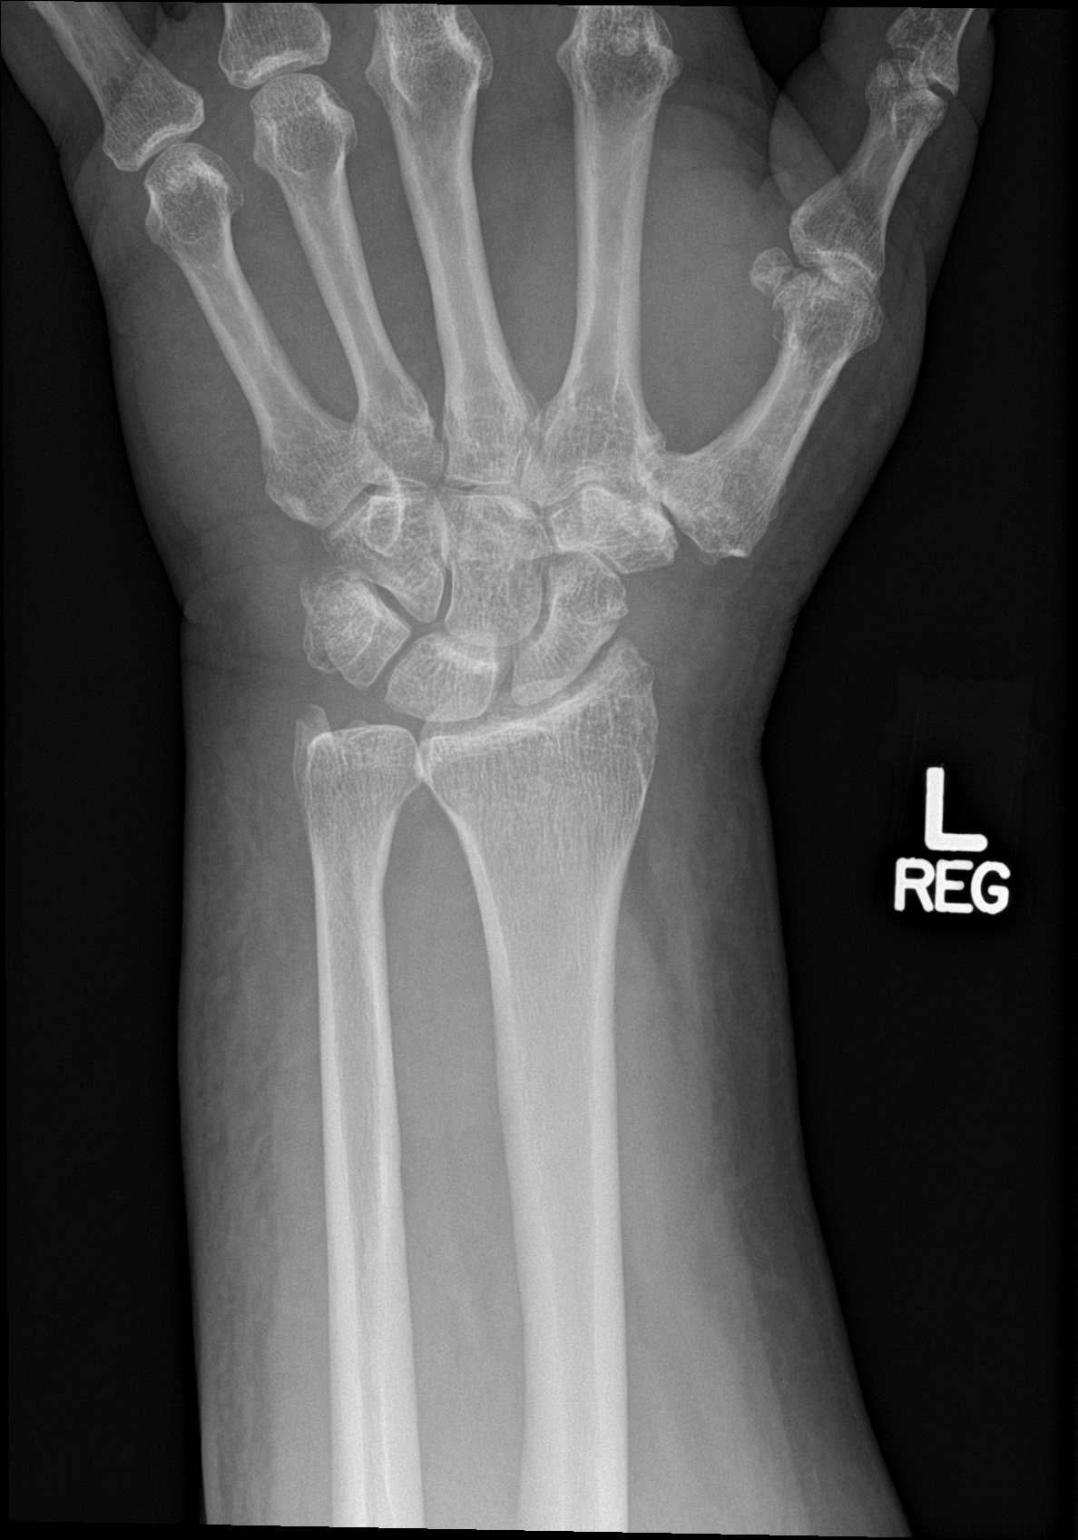

[wrist obl]
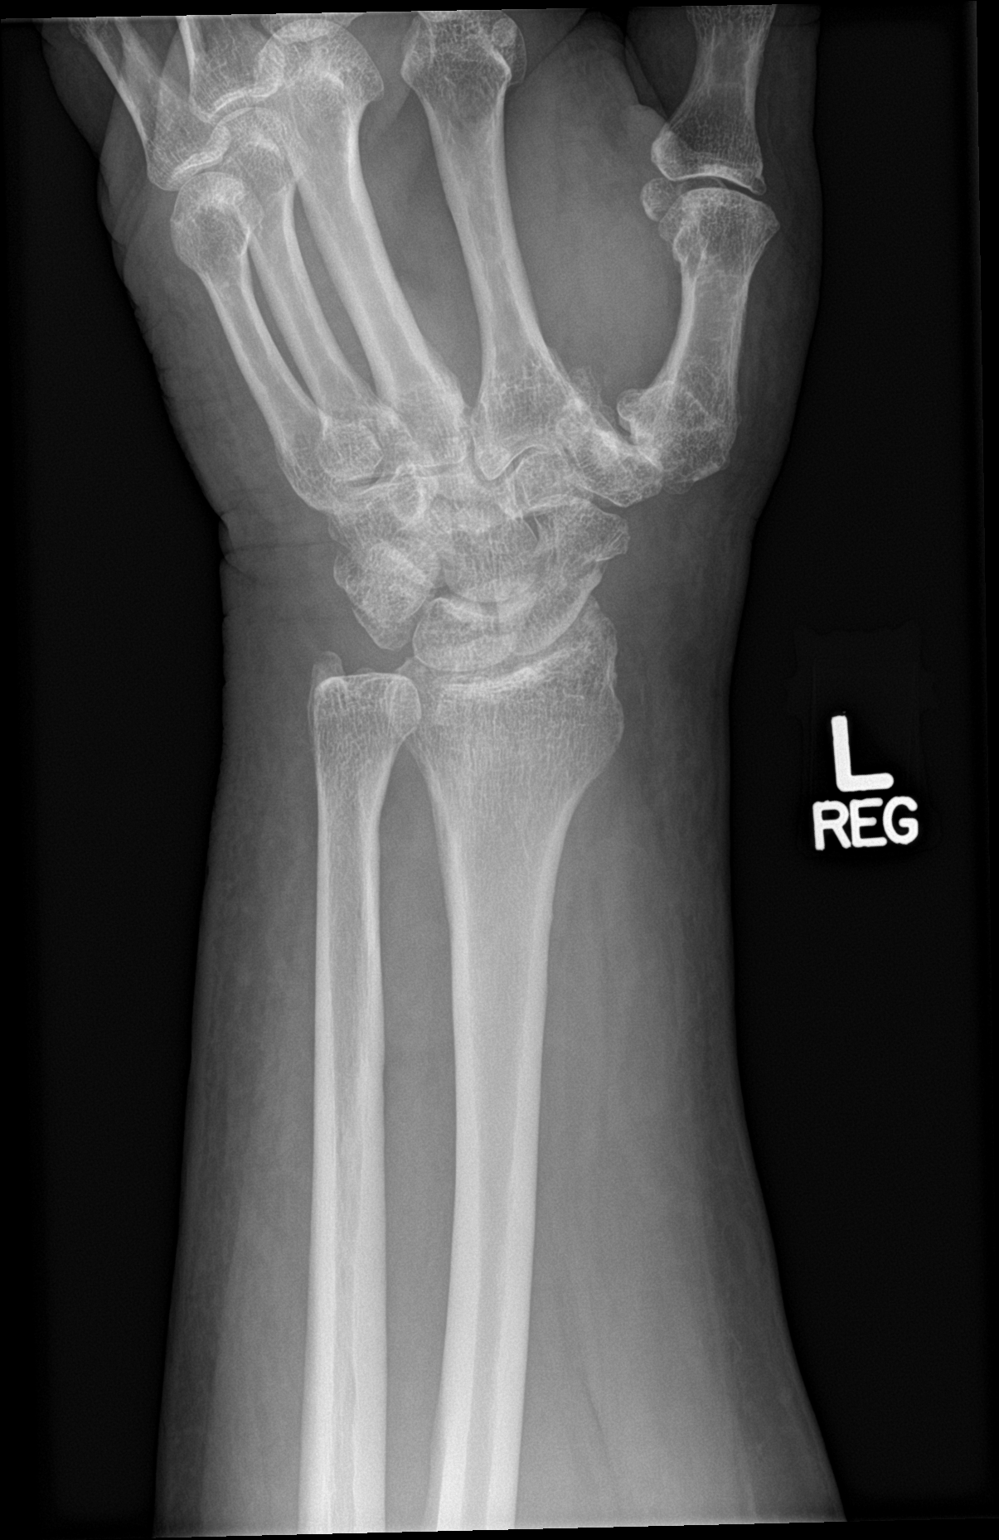

[wrist lat]
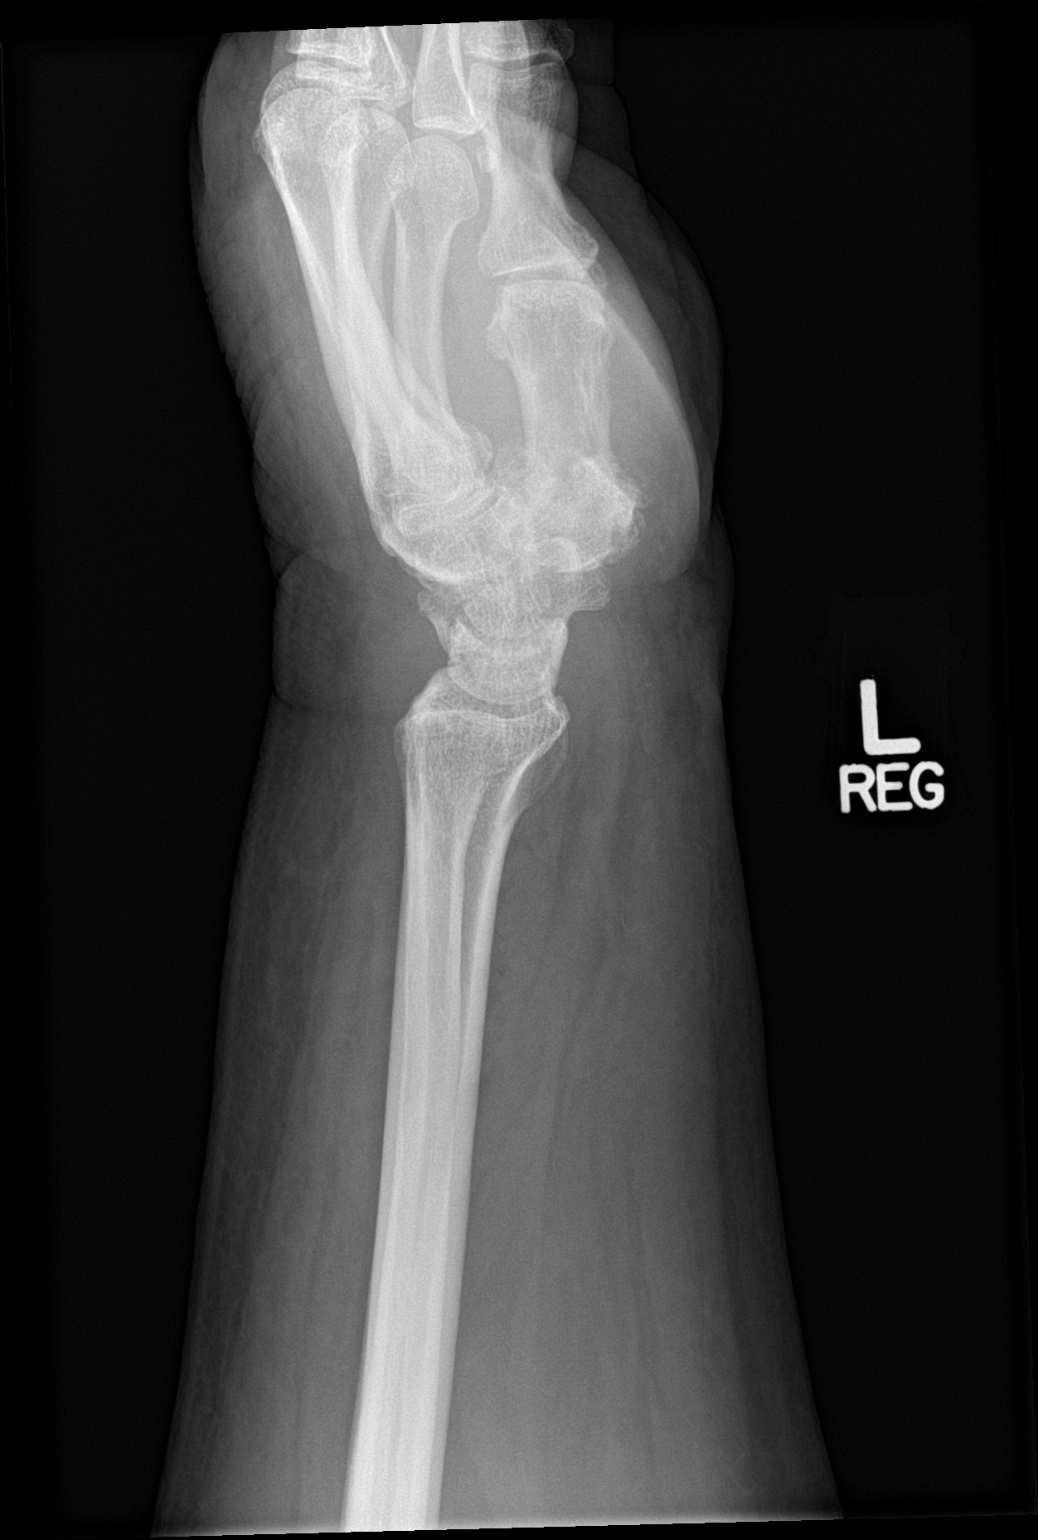

[wrist navicular]
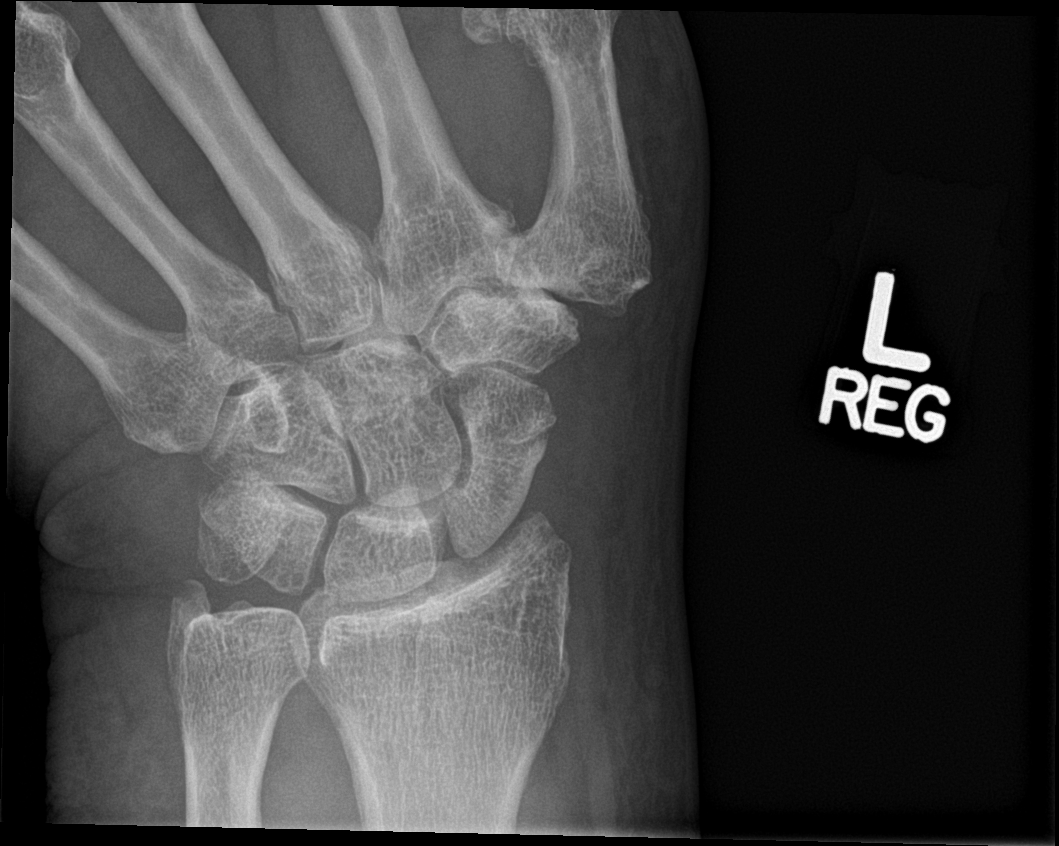

[4 of 4 positions shown; findings below may reference images not displayed]

FINDINGS: Normal alignment. No acute fracture or dislocation. Moderate
degenerative arthritis at the first CMC joint at the base of the
thumb. Mild degenerative arthritis involving the visualized first
MCP joint. Soft tissues are unremarkable.
IMPRESSION: Mild to moderate degenerative changes involving the base of the
thumb. No acute fracture or dislocation.

## 2022-05-31 ENCOUNTER — Other Ambulatory Visit: Payer: Self-pay | Admitting: Family Medicine

## 2022-05-31 DIAGNOSIS — E785 Hyperlipidemia, unspecified: Secondary | ICD-10-CM

## 2022-05-31 NOTE — Telephone Encounter (Signed)
Please contact the patient establish with a PCP. Sending 60 day medication refill. Thanks

## 2022-05-31 NOTE — Telephone Encounter (Signed)
V.Mail was left for patient to return call and schedule appt with new pcp!

## 2022-06-30 ENCOUNTER — Other Ambulatory Visit: Payer: Self-pay | Admitting: Osteopathic Medicine

## 2022-06-30 DIAGNOSIS — I10 Essential (primary) hypertension: Secondary | ICD-10-CM

## 2022-06-30 DIAGNOSIS — F419 Anxiety disorder, unspecified: Secondary | ICD-10-CM

## 2022-07-10 ENCOUNTER — Ambulatory Visit: Payer: Medicare HMO | Admitting: Medical-Surgical

## 2022-07-12 ENCOUNTER — Ambulatory Visit (INDEPENDENT_AMBULATORY_CARE_PROVIDER_SITE_OTHER): Payer: Medicare HMO | Admitting: Family Medicine

## 2022-07-12 ENCOUNTER — Encounter: Payer: Self-pay | Admitting: Family Medicine

## 2022-07-12 VITALS — BP 147/79 | HR 72 | Ht 70.0 in | Wt 291.0 lb

## 2022-07-12 DIAGNOSIS — F419 Anxiety disorder, unspecified: Secondary | ICD-10-CM

## 2022-07-12 DIAGNOSIS — R7303 Prediabetes: Secondary | ICD-10-CM | POA: Diagnosis not present

## 2022-07-12 DIAGNOSIS — M19042 Primary osteoarthritis, left hand: Secondary | ICD-10-CM

## 2022-07-12 DIAGNOSIS — R69 Illness, unspecified: Secondary | ICD-10-CM | POA: Diagnosis not present

## 2022-07-12 DIAGNOSIS — I10 Essential (primary) hypertension: Secondary | ICD-10-CM

## 2022-07-12 DIAGNOSIS — L409 Psoriasis, unspecified: Secondary | ICD-10-CM

## 2022-07-12 DIAGNOSIS — M25532 Pain in left wrist: Secondary | ICD-10-CM | POA: Diagnosis not present

## 2022-07-12 DIAGNOSIS — Z125 Encounter for screening for malignant neoplasm of prostate: Secondary | ICD-10-CM | POA: Diagnosis not present

## 2022-07-12 DIAGNOSIS — E785 Hyperlipidemia, unspecified: Secondary | ICD-10-CM | POA: Diagnosis not present

## 2022-07-12 DIAGNOSIS — M19041 Primary osteoarthritis, right hand: Secondary | ICD-10-CM | POA: Diagnosis not present

## 2022-07-12 MED ORDER — PAROXETINE HCL 20 MG PO TABS: 20.0000 mg | ORAL_TABLET | Freq: Every day | ORAL | 3 refills | Status: DC

## 2022-07-12 MED ORDER — ROPINIROLE HCL 0.5 MG PO TABS
ORAL_TABLET | ORAL | 3 refills | Status: DC
Start: 2022-07-12 — End: 2023-07-16

## 2022-07-12 MED ORDER — MELOXICAM 15 MG PO TABS: 15.0000 mg | ORAL_TABLET | Freq: Every day | ORAL | 1 refills | Status: DC

## 2022-07-12 MED ORDER — LISINOPRIL-HYDROCHLOROTHIAZIDE 20-25 MG PO TABS: 1.0000 | ORAL_TABLET | Freq: Every day | ORAL | 3 refills | Status: DC

## 2022-07-12 MED ORDER — SIMVASTATIN 80 MG PO TABS: 80.0000 mg | ORAL_TABLET | Freq: Every day | ORAL | 3 refills | Status: DC

## 2022-07-12 NOTE — Patient Instructions (Addendum)
Nice to meet you today.   Continue current medications.  See me again in about 6 months.

## 2022-07-12 NOTE — Assessment & Plan Note (Signed)
Doing well with paxil.  Continue at current strength.

## 2022-07-12 NOTE — Assessment & Plan Note (Signed)
Update a1c

## 2022-07-12 NOTE — Assessment & Plan Note (Signed)
Management per dermatology.  Doing well with skyrizi

## 2022-07-12 NOTE — Progress Notes (Signed)
Nathaniel Montes - 65 y.o. male MRN [id number]  Date of birth: 1956/05/17  Subjective Chief Complaint  Patient presents with   Transitions Of Care    HPI Nathaniel Montes is a 66 y.o. male here today for follow up visit.  He is a former patient of Dr. Sheppard Coil.    He has a history HTN that is treated with lisinopril/hctz.  He has been doing well with this.  Denies side effects.  He has not had chest pain, shortness of breath, palpitations, headache or vision changes.  Taking meloxicam for OA of the hand and wrist.  This is helpful.   Ropinirole remains effective at current strength for RLS.    Tolerating simvastatin well for management of hyperlipidemia.  Seeing dermatology for psoriasis.  On Orson Ape which has been very effective for him.    ROS:  A comprehensive ROS was completed and negative except as noted per HPI  No Known Allergies  Past Medical History:  Diagnosis Date   Anxiety 11/17/2012   HYPERLIPIDEMIA 11/29/2009   HYPERTENSION 11/29/2009   Psoriasis 11/17/2012    Past Surgical History:  Procedure Laterality Date   APPENDECTOMY     COLONOSCOPY  08/16/2010   2008   LEG SURGERY Left    broken bone left lower leg, repaired   meniscus left knee Left    repair   POLYPECTOMY     REPLACEMENT TOTAL KNEE Right 12/2018   SHOULDER SURGERY Right    torn bicep, rotator cuff, ligament work injury    Social History   Socioeconomic History   Marital status: Married    Spouse name: Tracie   Number of children: 2   Years of education: 12   Highest education level: 12th grade  Occupational History    Comment: Retired  Tobacco Use   Smoking status: Former    Types: Cigarettes    Quit date: 1996    Years since quitting: 27.6   Smokeless tobacco: Never  Vaping Use   Vaping Use: Never used  Substance and Sexual Activity   Alcohol use: Yes    Comment: 2 case of beer per week   Drug use: No   Sexual activity: Not on file  Other Topics Concern   Not on file   Social History Narrative   Lives with wife and his youngest son. He hasn't been able to do much exercise lately. He is starting to go camping soon.   Social Determinants of Health   Financial Resource Strain: Low Risk  (06/20/2021)   Overall Financial Resource Strain (CARDIA)    Difficulty of Paying Living Expenses: Not hard at all  Food Insecurity: No Food Insecurity (06/20/2021)   Hunger Vital Sign    Worried About Running Out of Food in the Last Year: Never true    Ran Out of Food in the Last Year: Never true  Transportation Needs: No Transportation Needs (06/20/2021)   PRAPARE - Hydrologist (Medical): No    Lack of Transportation (Non-Medical): No  Physical Activity: Inactive (06/20/2021)   Exercise Vital Sign    Days of Exercise per Week: 0 days    Minutes of Exercise per Session: 0 min  Stress: No Stress Concern Present (06/20/2021)   [place]    Feeling of Stress : Not at all  Social Connections: Moderately Isolated (06/20/2021)   Social Connection and Isolation Panel [NHANES]    Frequency of Communication with  Friends and Family: More than three times a week    Frequency of Social Gatherings with Friends and Family: Twice a week    Attends Religious Services: Never    Marine scientist or Organizations: No    Attends Music therapist: Never    Marital Status: Married    Family History  Problem Relation Age of Onset   Hypertension Father    Healthy Sister    Healthy Brother    Healthy Sister    Colon cancer Neg Hx    Colon polyps Neg Hx    Esophageal cancer Neg Hx    Rectal cancer Neg Hx    Stomach cancer Neg Hx     Health Maintenance  Topic Date Due   INFLUENZA VACCINE  12/03/2022 (Originally 07/03/2022)   COVID-19 Vaccine (4 - Moderna series) 12/03/2022 (Originally 12/26/2020)   COLONOSCOPY (Pts 45-35yr Insurance coverage will need to be confirmed)   11/16/2022   TETANUS/TDAP  10/18/2027   Pneumonia Vaccine 66 Years old  Completed   Zoster Vaccines- Shingrix  Completed   HPV VACCINES  Aged Out   Hepatitis C Screening  Discontinued     ----------------------------------------------------------------------------------------------------------------------------------------------------------------------------------------------------------------- Physical Exam BP (!) 147/79 (BP Location: Left Arm, Patient Position: Sitting, Cuff Size: Large)   Pulse 72   Ht '5\' 10"'$  (1.778 m)   Wt 291 lb (132 kg)   SpO2 94%   BMI 41.75 kg/m   Physical Exam Constitutional:      Appearance: Normal appearance.  Eyes:     General: No scleral icterus. Cardiovascular:     Rate and Rhythm: Normal rate and regular rhythm.  Pulmonary:     Effort: Pulmonary effort is normal.     Breath sounds: Normal breath sounds.  Musculoskeletal:     Cervical back: Neck supple.  Neurological:     General: No focal deficit present.     Mental Status: He is alert.  Psychiatric:        Mood and Affect: Mood normal.        Behavior: Behavior normal.     ------------------------------------------------------------------------------------------------------------------------------------------------------------------------------------------------------------------- Assessment and Plan  Essential hypertension BP is slightly elevated today.  Readings improved at home.  Recommend continuation of current medications for management of HTN.  Low sodium diet recommended.   Psoriasis Management per dermatology.  Doing well with skyrizi  Osteoarthritis of both hands Improved with meloxicam as needed.  Discussed he can also try topical voltaren prn.    Prediabetes Update a1c  Anxiety Doing well with paxil.  Continue at current strength.    Hyperlipidemia with target low density lipoprotein (LDL) cholesterol less than 130 mg/dL Tolerating simvastatin well.  Continue at  current strength.  Update lipid panel.    Meds ordered this encounter  Medications   lisinopril-hydrochlorothiazide (ZESTORETIC) 20-25 MG tablet    Sig: Take 1 tablet by mouth daily.    Dispense:  90 tablet    Refill:  3   meloxicam (MOBIC) 15 MG tablet    Sig: Take 1 tablet (15 mg total) by mouth daily.    Dispense:  30 tablet    Refill:  1   simvastatin (ZOCOR) 80 MG tablet    Sig: Take 1 tablet (80 mg total) by mouth daily.    Dispense:  90 tablet    Refill:  3   rOPINIRole (REQUIP) 0.5 MG tablet    Sig: TAKE 1/2 TO 1 TAB AT BEDTIME AS NEEDED FOR RESTLESS LEG    Dispense:  90 tablet    Refill:  3   PARoxetine (PAXIL) 20 MG tablet    Sig: Take 1 tablet (20 mg total) by mouth daily.    Dispense:  90 tablet    Refill:  3    Return in about 6 months (around 01/12/2023) for HTN.    This visit occurred during the SARS-CoV-2 public health emergency.  Safety protocols were in place, including screening questions prior to the visit, additional usage of staff PPE, and extensive cleaning of exam room while observing appropriate contact time as indicated for disinfecting solutions.

## 2022-07-12 NOTE — Assessment & Plan Note (Signed)
BP is slightly elevated today.  Readings improved at home.  Recommend continuation of current medications for management of HTN.  Low sodium diet recommended.

## 2022-07-12 NOTE — Assessment & Plan Note (Signed)
Improved with meloxicam as needed.  Discussed he can also try topical voltaren prn.

## 2022-07-12 NOTE — Assessment & Plan Note (Signed)
Tolerating simvastatin well.  Continue at current strength.  Update lipid panel.

## 2022-07-13 LAB — COMPLETE METABOLIC PANEL WITH GFR
AG Ratio: 1.8 (calc) (ref 1.0–2.5)
ALT: 27 U/L (ref 9–46)
AST: 23 U/L (ref 10–35)
Albumin: 4.4 g/dL (ref 3.6–5.1)
Alkaline phosphatase (APISO): 58 U/L (ref 35–144)
BUN: 17 mg/dL (ref 7–25)
CO2: 28 mmol/L (ref 20–32)
Calcium: 9.9 mg/dL (ref 8.6–10.3)
Chloride: 100 mmol/L (ref 98–110)
Creat: 1.3 mg/dL (ref 0.70–1.35)
Globulin: 2.5 g/dL (calc) (ref 1.9–3.7)
Glucose, Bld: 97 mg/dL (ref 65–139)
Potassium: 4.5 mmol/L (ref 3.5–5.3)
Sodium: 137 mmol/L (ref 135–146)
Total Bilirubin: 1.4 mg/dL — ABNORMAL HIGH (ref 0.2–1.2)
Total Protein: 6.9 g/dL (ref 6.1–8.1)
eGFR: 60 mL/min/{1.73_m2} (ref >=60)

## 2022-07-13 LAB — CBC WITH DIFFERENTIAL/PLATELET
Absolute Monocytes: 688 cells/uL (ref 200–950)
Basophils Absolute: 48 cells/uL (ref 0–200)
Basophils Relative: 0.6 %
Eosinophils Absolute: 72 cells/uL (ref 15–500)
Eosinophils Relative: 0.9 %
HCT: 44 % (ref 38.5–50.0)
Hemoglobin: 14.8 g/dL (ref 13.2–17.1)
Lymphs Abs: 2064 cells/uL (ref 850–3900)
MCH: 33.7 pg — ABNORMAL HIGH (ref 27.0–33.0)
MCHC: 33.6 g/dL (ref 32.0–36.0)
MCV: 100.2 fL — ABNORMAL HIGH (ref 80.0–100.0)
MPV: 11 fL (ref 7.5–12.5)
Monocytes Relative: 8.6 %
Neutro Abs: 5128 cells/uL (ref 1500–7800)
Neutrophils Relative %: 64.1 %
Platelets: 293 10*3/uL (ref 140–400)
RBC: 4.39 10*6/uL (ref 4.20–5.80)
RDW: 12.4 % (ref 11.0–15.0)
Total Lymphocyte: 25.8 %
WBC: 8 10*3/uL (ref 3.8–10.8)

## 2022-07-13 LAB — LIPID PANEL W/REFLEX DIRECT LDL
Cholesterol: 153 mg/dL (ref <200)
HDL: 51 mg/dL (ref >=40)
LDL Cholesterol (Calc): 83 mg/dL (calc)
Non-HDL Cholesterol (Calc): 102 mg/dL (calc) (ref <130)
Total CHOL/HDL Ratio: 3 (calc) (ref <5.0)
Triglycerides: 99 mg/dL (ref <150)

## 2022-07-13 LAB — HEMOGLOBIN A1C
Hgb A1c MFr Bld: 6.1 % of total Hgb — ABNORMAL HIGH (ref <5.7)
Mean Plasma Glucose: 128 mg/dL
eAG (mmol/L): 7.1 mmol/L

## 2022-07-13 LAB — TSH: TSH: 1.65 mIU/L (ref 0.40–4.50)

## 2022-07-13 LAB — PSA: PSA: 3.01 ng/mL (ref <=4.00)

## 2022-07-19 ENCOUNTER — Other Ambulatory Visit: Payer: Self-pay | Admitting: Family Medicine

## 2022-07-19 DIAGNOSIS — R17 Unspecified jaundice: Secondary | ICD-10-CM

## 2022-07-19 DIAGNOSIS — D7589 Other specified diseases of blood and blood-forming organs: Secondary | ICD-10-CM

## 2022-07-25 ENCOUNTER — Encounter: Payer: Self-pay | Admitting: General Practice

## 2022-09-08 ENCOUNTER — Other Ambulatory Visit: Payer: Self-pay | Admitting: Family Medicine

## 2022-09-08 DIAGNOSIS — M25532 Pain in left wrist: Secondary | ICD-10-CM

## 2022-11-10 ENCOUNTER — Other Ambulatory Visit: Payer: Self-pay | Admitting: Family Medicine

## 2022-11-10 DIAGNOSIS — M25532 Pain in left wrist: Secondary | ICD-10-CM

## 2022-11-14 DIAGNOSIS — L408 Other psoriasis: Secondary | ICD-10-CM | POA: Diagnosis not present

## 2022-11-15 DIAGNOSIS — Z79899 Other long term (current) drug therapy: Secondary | ICD-10-CM | POA: Diagnosis not present

## 2022-11-15 DIAGNOSIS — L400 Psoriasis vulgaris: Secondary | ICD-10-CM | POA: Diagnosis not present

## 2023-01-12 ENCOUNTER — Other Ambulatory Visit: Payer: Self-pay | Admitting: Family Medicine

## 2023-01-12 DIAGNOSIS — M25532 Pain in left wrist: Secondary | ICD-10-CM

## 2023-01-15 ENCOUNTER — Encounter: Payer: Self-pay | Admitting: Family Medicine

## 2023-01-15 ENCOUNTER — Ambulatory Visit (INDEPENDENT_AMBULATORY_CARE_PROVIDER_SITE_OTHER): Payer: Medicare HMO | Admitting: Family Medicine

## 2023-01-15 VITALS — BP 167/80 | HR 79 | Ht 72.0 in | Wt 301.1 lb

## 2023-01-15 DIAGNOSIS — Z23 Encounter for immunization: Secondary | ICD-10-CM | POA: Diagnosis not present

## 2023-01-15 DIAGNOSIS — R0683 Snoring: Secondary | ICD-10-CM

## 2023-01-15 DIAGNOSIS — R29818 Other symptoms and signs involving the nervous system: Secondary | ICD-10-CM

## 2023-01-15 DIAGNOSIS — D7589 Other specified diseases of blood and blood-forming organs: Secondary | ICD-10-CM | POA: Diagnosis not present

## 2023-01-15 DIAGNOSIS — L821 Other seborrheic keratosis: Secondary | ICD-10-CM | POA: Diagnosis not present

## 2023-01-15 DIAGNOSIS — E785 Hyperlipidemia, unspecified: Secondary | ICD-10-CM

## 2023-01-15 DIAGNOSIS — I10 Essential (primary) hypertension: Secondary | ICD-10-CM | POA: Diagnosis not present

## 2023-01-15 DIAGNOSIS — Z1211 Encounter for screening for malignant neoplasm of colon: Secondary | ICD-10-CM | POA: Diagnosis not present

## 2023-01-15 DIAGNOSIS — R17 Unspecified jaundice: Secondary | ICD-10-CM | POA: Diagnosis not present

## 2023-01-15 MED ORDER — AMLODIPINE BESYLATE 5 MG PO TABS: 5.0000 mg | ORAL_TABLET | Freq: Every day | ORAL | 1 refills | Status: DC

## 2023-01-15 NOTE — Assessment & Plan Note (Signed)
Reassured patient.  This area does not bother him.

## 2023-01-15 NOTE — Assessment & Plan Note (Signed)
Referral entered for home sleep study.

## 2023-01-15 NOTE — Patient Instructions (Signed)
Continue lisinopril/hctz Add amlodipine for blood pressure.  Return for nurse visit in 2 weeks.  See me again in 6 months.

## 2023-01-15 NOTE — Progress Notes (Signed)
[name] - 67 y.o. male MRN [id number]  Date of birth: [birth year]  Subjective Chief Complaint  Patient presents with   Follow-up    6 month f/u    HPI Nathaniel Montes is a 67 y.o. male here today for follow up visit.   Continues on lisinopril/hctz.  He is taking this regularly.  BP is elevated today.  Denies side effects from medication.  He has not had chest pain, shortness of breath, palpitations, headache or vision changes.   Reports that his wife has told him that he snores heavily and appears to stop breathing when sleeping.  He would like to have referral for sleep study.   Continues on requip for RLS as needed.  This remains pretty effective for his symptoms.   Tolerating simvastatin at current strength.    Has skin lesion on his back that he would like to have evaluated.   ROS:  A comprehensive ROS was completed and negative except as noted per HPI  No Known Allergies  Past Medical History:  Diagnosis Date   Anxiety 11/17/2012   HYPERLIPIDEMIA 11/29/2009   HYPERTENSION 11/29/2009   Psoriasis 11/17/2012    Past Surgical History:  Procedure Laterality Date   APPENDECTOMY     COLONOSCOPY  08/16/2010   2008   LEG SURGERY Left    broken bone left lower leg, repaired   meniscus left knee Left    repair   POLYPECTOMY     REPLACEMENT TOTAL KNEE Right 12/2018   SHOULDER SURGERY Right    torn bicep, rotator cuff, ligament work injury    Social History   Socioeconomic History   Marital status: Married    Spouse name: Nathaniel Montes   Number of children: 2   Years of education: 12   Highest education level: 12th grade  Occupational History    Comment: Retired  Tobacco Use   Smoking status: Former    Types: Cigarettes    Quit date: 1996    Years since quitting: 28.1   Smokeless tobacco: Never  Vaping Use   Vaping Use: Never used  Substance and Sexual Activity   Alcohol use: Yes    Comment: 2 case of beer per week   Drug use: No   Sexual activity: Not on  file  Other Topics Concern   Not on file  Social History Narrative   Lives with wife and his youngest son. He hasn't been able to do much exercise lately. He is starting to go camping soon.   Social Determinants of Health   Financial Resource Strain: Low Risk  (06/20/2021)   Overall Financial Resource Strain (CARDIA)    Difficulty of Paying Living Expenses: Not hard at all  Food Insecurity: No Food Insecurity (06/20/2021)   Hunger Vital Sign    Worried About Running Out of Food in the Last Year: Never true    Ran Out of Food in the Last Year: Never true  Transportation Needs: No Transportation Needs (06/20/2021)   PRAPARE - Hydrologist (Medical): No    Lack of Transportation (Non-Medical): No  Physical Activity: Inactive (06/20/2021)   Exercise Vital Sign    Days of Exercise per Week: 0 days    Minutes of Exercise per Session: 0 min  Stress: No Stress Concern Present (06/20/2021)   [place]    Feeling of Stress : Not at all  Social Connections: Moderately Isolated (06/20/2021)   Social  Connection and Isolation Panel [NHANES]    Frequency of Communication with Friends and Family: More than three times a week    Frequency of Social Gatherings with Friends and Family: Twice a week    Attends Religious Services: Never    Marine scientist or Organizations: No    Attends Music therapist: Never    Marital Status: Married    Family History  Problem Relation Age of Onset   Hypertension Father    Healthy Sister    Healthy Brother    Healthy Sister    Colon cancer Neg Hx    Colon polyps Neg Hx    Esophageal cancer Neg Hx    Rectal cancer Neg Hx    Stomach cancer Neg Hx     Health Maintenance  Topic Date Due   Medicare Annual Wellness (AWV)  06/20/2022   COLONOSCOPY (Pts 45-16yr Insurance coverage will need to be confirmed)  11/16/2022   COVID-19 Vaccine (4 -  2023-24 season) 01/31/2023 (Originally 08/03/2022)   DTaP/Tdap/Td (3 - Td or Tdap) 10/18/2027   Pneumonia Vaccine 67 Years old  Completed   INFLUENZA VACCINE  Completed   Zoster Vaccines- Shingrix  Completed   HPV VACCINES  Aged Out   Hepatitis C Screening  Discontinued     ----------------------------------------------------------------------------------------------------------------------------------------------------------------------------------------------------------------- Physical Exam BP (!) 167/80 (BP Location: Left Arm, Patient Position: Sitting, Cuff Size: Large)   Pulse 79   Ht 6' (1.829 m)   Wt (!) 301 lb 1.9 oz (136.6 kg)   SpO2 94%   BMI 40.84 kg/m   Physical Exam Constitutional:      Appearance: Normal appearance.  HENT:     Head: Normocephalic and atraumatic.  Eyes:     General: No scleral icterus. Cardiovascular:     Rate and Rhythm: Normal rate and regular rhythm.  Pulmonary:     Effort: Pulmonary effort is normal.     Breath sounds: Normal breath sounds.  Musculoskeletal:     Cervical back: Neck supple.  Skin:    Comments: Pigmented, raised lesion consistent with SK.    Neurological:     Mental Status: He is alert.  Psychiatric:        Mood and Affect: Mood normal.        Behavior: Behavior normal.     ------------------------------------------------------------------------------------------------------------------------------------------------------------------------------------------------------------------- Assessment and Plan  Essential hypertension BP is elevated.  Continue lisinopril/hctz at current strength.  Adding amlodipine 519mdaily.  Return in 2 weeks for BP recheck.   Suspected sleep apnea Referral entered for home sleep study.   Hyperlipidemia with target low density lipoprotein (LDL) cholesterol less than 130 mg/dL Tolerating simvastatin well.  Continue at current strength.   Seborrheic keratoses Reassured patient.  This area  does not bother him.    Meds ordered this encounter  Medications   amLODipine (NORVASC) 5 MG tablet    Sig: Take 1 tablet (5 mg total) by mouth daily.    Dispense:  90 tablet    Refill:  1    Return in about 6 months (around 07/16/2023) for HTN.    This visit occurred during the SARS-CoV-2 public health emergency.  Safety protocols were in place, including screening questions prior to the visit, additional usage of staff PPE, and extensive cleaning of exam room while observing appropriate contact time as indicated for disinfecting solutions.

## 2023-01-15 NOTE — Assessment & Plan Note (Signed)
Tolerating simvastatin well.  Continue at current strength.

## 2023-01-15 NOTE — Assessment & Plan Note (Signed)
BP is elevated.  Continue lisinopril/hctz at current strength.  Adding amlodipine 59m daily.  Return in 2 weeks for BP recheck.

## 2023-01-16 LAB — HEPATIC FUNCTION PANEL
AG Ratio: 1.7 (calc) (ref 1.0–2.5)
ALT: 34 U/L (ref 9–46)
AST: 32 U/L (ref 10–35)
Albumin: 4.5 g/dL (ref 3.6–5.1)
Alkaline phosphatase (APISO): 54 U/L (ref 35–144)
Bilirubin, Direct: 0.2 mg/dL (ref 0.0–0.2)
Globulin: 2.7 g/dL (calc) (ref 1.9–3.7)
Indirect Bilirubin: 0.8 mg/dL (calc) (ref 0.2–1.2)
Total Bilirubin: 1 mg/dL (ref 0.2–1.2)
Total Protein: 7.2 g/dL (ref 6.1–8.1)

## 2023-01-16 LAB — VITAMIN B12: Vitamin B-12: 265 pg/mL (ref 200–1100)

## 2023-01-16 LAB — FOLATE: Folate: 9.8 ng/mL

## 2023-01-18 NOTE — Addendum Note (Signed)
Addended by: Perlie Mayo on: 01/18/2023 08:06 AM   Modules accepted: Orders

## 2023-01-29 ENCOUNTER — Ambulatory Visit (INDEPENDENT_AMBULATORY_CARE_PROVIDER_SITE_OTHER): Payer: Medicare HMO | Admitting: Family Medicine

## 2023-01-29 VITALS — BP 133/68 | HR 70 | Ht 72.0 in

## 2023-01-29 DIAGNOSIS — I10 Essential (primary) hypertension: Secondary | ICD-10-CM

## 2023-01-29 NOTE — Progress Notes (Signed)
   Established Patient Office Visit  Subjective   Patient ID: Nathaniel Montes, male    DOB: 1956/06/23  Age: 67 y.o. MRN: [id number]  Chief Complaint  Patient presents with   Hypertension    BP check - nurse visit     HPI  Hypertension- nurse visit BP check - patient denies chest pain, shortness of breath, palpitations, headache, vision changes or problems with new medication, Amlodipine 49m. .  ROS    Objective:     BP 133/68   Pulse 70   Ht 6' (1.829 m)   SpO2 98%   BMI 40.84 kg/m    Physical Exam   No results found for any visits on 01/29/23.    The 10-year ASCVD risk score (Arnett DK, et al., 2019) is: 16.6%    Assessment & Plan:  Hypertension- BP check - nurse visit-  initial BP reading = 158/72 , second reading 133/68. Reviewed by CLuetta Nutting DO secondary reading within acceptable range. Patient to keep upcoming appointment scheduled  for 07/16/2023 for HTN follow up. ( Patient also inquiring about the  home sleep study ordered on 01/18/23 as he has not heard from anyone - called [facility] and was informed that this is in process and patient will be informed once insurance coverage for procedure has been verified)  Problem List Items Addressed This Visit   None   Return in about 24 weeks (around 07/16/2023) for HTN f/u visit with CLuetta Nutting DO.    KRae Lips LPN

## 2023-01-29 NOTE — Progress Notes (Signed)
Medical screening examination/treatment was performed by qualified clinical staff member and as supervising physician I was immediately available for consultation/collaboration. I have reviewed documentation and agree with assessment and plan.  Yadira Hada, DO  

## 2023-02-10 ENCOUNTER — Other Ambulatory Visit: Payer: Self-pay | Admitting: Family Medicine

## 2023-02-10 DIAGNOSIS — M25532 Pain in left wrist: Secondary | ICD-10-CM

## 2023-02-13 ENCOUNTER — Encounter: Payer: Self-pay | Admitting: Gastroenterology

## 2023-03-08 ENCOUNTER — Telehealth: Payer: Self-pay

## 2023-03-08 NOTE — Telephone Encounter (Signed)
Pt lvm stating he still has not heard from anyone concerning his Home Sleep Study.   Notes on 01/29/23 state: (Patient also inquiring about the home sleep study ordered on 01/18/23 as he has not heard from anyone - called [facility] and was informed that this is in process and patient will be informed once insurance coverage for procedure has been verified.  Forwarded messge to Referral Coordinator for follow-up.   Pt is requesting a callback from [facility] concerning the update.   Thanks

## 2023-03-11 ENCOUNTER — Other Ambulatory Visit: Payer: Self-pay | Admitting: Family Medicine

## 2023-03-11 DIAGNOSIS — M25532 Pain in left wrist: Secondary | ICD-10-CM

## 2023-03-11 NOTE — Telephone Encounter (Signed)
Contacted Terri at [facility] to check the status of sleep study referral. Per Terri, patient will be contacted again today to schedule sleep study appointment.  Left message on patients voicemail updating patient on sleep study referral. Patient also given [facility] phone number [phone].

## 2023-03-12 ENCOUNTER — Encounter: Payer: Self-pay | Admitting: Gastroenterology

## 2023-03-25 ENCOUNTER — Ambulatory Visit (HOSPITAL_BASED_OUTPATIENT_CLINIC_OR_DEPARTMENT_OTHER): Payer: Medicare HMO | Admitting: Internal Medicine

## 2023-03-25 DIAGNOSIS — R0683 Snoring: Secondary | ICD-10-CM

## 2023-03-25 DIAGNOSIS — R29818 Other symptoms and signs involving the nervous system: Secondary | ICD-10-CM

## 2023-03-26 ENCOUNTER — Ambulatory Visit (HOSPITAL_BASED_OUTPATIENT_CLINIC_OR_DEPARTMENT_OTHER): Payer: Medicare HMO | Admitting: Internal Medicine

## 2023-03-26 VITALS — Ht 70.0 in | Wt 300.0 lb

## 2023-03-27 ENCOUNTER — Ambulatory Visit (HOSPITAL_BASED_OUTPATIENT_CLINIC_OR_DEPARTMENT_OTHER): Payer: Medicare HMO | Attending: Family Medicine | Admitting: Internal Medicine

## 2023-03-27 VITALS — Ht 70.0 in | Wt 300.0 lb

## 2023-03-27 DIAGNOSIS — G4736 Sleep related hypoventilation in conditions classified elsewhere: Secondary | ICD-10-CM | POA: Insufficient documentation

## 2023-03-27 DIAGNOSIS — G4733 Obstructive sleep apnea (adult) (pediatric): Secondary | ICD-10-CM | POA: Insufficient documentation

## 2023-03-27 DIAGNOSIS — R29818 Other symptoms and signs involving the nervous system: Secondary | ICD-10-CM

## 2023-03-27 DIAGNOSIS — R0683 Snoring: Secondary | ICD-10-CM

## 2023-04-02 ENCOUNTER — Ambulatory Visit (AMBULATORY_SURGERY_CENTER): Payer: Medicare HMO | Admitting: *Deleted

## 2023-04-02 ENCOUNTER — Encounter: Payer: Self-pay | Admitting: Gastroenterology

## 2023-04-02 VITALS — Ht 71.0 in | Wt 300.0 lb

## 2023-04-02 DIAGNOSIS — Z86010 Personal history of colonic polyps: Secondary | ICD-10-CM

## 2023-04-02 MED ORDER — NA SULFATE-K SULFATE-MG SULF 17.5-3.13-1.6 GM/177ML PO SOLN
1.0000 | Freq: Once | ORAL | 0 refills | Status: AC
Start: 2023-04-02 — End: 2023-04-02

## 2023-04-02 NOTE — Progress Notes (Signed)
No egg or soy allergy known to patient  No issues known to pt with past sedation with any surgeries or procedures Patient denies ever being told they had issues or difficulty with intubation  No FH of Malignant Hyperthermia Pt is not on diet pills Pt is not on  home 02  Pt is not on blood thinners  Pt denies issues with constipation  No A fib or A flutter Have any cardiac testing pending--NO Pt instructed to use Singlecare.com or GoodRx for a price reduction on prep    INDEPENDENT WITH MOBILITY  Patient's chart reviewed by Nathaniel Montes CNRA prior to previsit and patient appropriate for the [facility].  Previsit completed and red dot placed by patient's name on their procedure day (on provider's schedule).    

## 2023-04-06 DIAGNOSIS — R0683 Snoring: Secondary | ICD-10-CM | POA: Diagnosis not present

## 2023-04-06 DIAGNOSIS — R29818 Other symptoms and signs involving the nervous system: Secondary | ICD-10-CM

## 2023-04-06 NOTE — Procedures (Signed)
   Patient Name: Nathaniel Montes, Nathaniel Montes Date: 03/27/2023 Gender: Male D.O.B: [birth year] Age (years): 51 Referring Provider: Elmarie Shiley MD Height (inches): 70 Interpreting Physician: Jetty Duhamel MD, ABSM Weight (lbs): 300 RPSGT: [place] Sink BMI: 43 MRN: [id number] Neck Size: 22.00  CLINICAL INFORMATION Sleep Study Type: HST Indication for sleep study: OSA Epworth Sleepiness Score: 0  SLEEP STUDY TECHNIQUE A multi-channel overnight portable sleep study was performed. The channels recorded were: nasal airflow, thoracic respiratory movement, and oxygen saturation with a pulse oximetry. Snoring was also monitored.  MEDICATIONS Patient self administered medications include: none reported.  SLEEP ARCHITECTURE Patient was studied for 366.9 minutes. The sleep efficiency was 100.0 % and the patient was supine for 0%. The arousal index was 0.0 per hour.  RESPIRATORY PARAMETERS The overall AHI was 118.9 per hour, with a central apnea index of 0 per hour. The oxygen nadir was 68% during sleep.  CARDIAC DATA Mean heart rate during sleep was 67.4 bpm.  IMPRESSIONS - Severe obstructive sleep apnea occurred during this study (AHI = 118.9/h). - Severe oxygen desaturation was noted during this study (Min O2 = 68%, Mean 86%). - Time with O2 saturation 88% or less was 222 minutes, indicating nocturnal hypoxemia. - Patient snored.  DIAGNOSIS - Obstructive Sleep Apnea (G47.33) - Nocturnal Hypoxemia (G47.36)  RECOMMENDATIONS - Suggest CPAP titration sleep study, which would document for insurance if supplemental O2 is appropriate. Other options, including Autopap would be based on clinical judgment. - Be careful with alcohol, sedatives and other CNS depressants that may worsen sleep apnea and disrupt normal sleep architecture. - Sleep hygiene should be reviewed to assess factors that may improve sleep quality. - Weight management and regular exercise should be initiated or  continued.  [Electronically signed] 04/06/2023 11:32 AM  Jetty Duhamel MD, ABSM Diplomate, American Board of Sleep Medicine NPI: [id number]                          Jetty Duhamel Diplomate, American Board of Sleep Medicine  ELECTRONICALLY SIGNED ON:  04/06/2023, 11:28 AM [facility] PH: [phone]   FX: [phone] ACCREDITED BY THE AMERICAN ACADEMY OF SLEEP MEDICINE

## 2023-04-14 ENCOUNTER — Other Ambulatory Visit: Payer: Self-pay | Admitting: Family Medicine

## 2023-04-14 DIAGNOSIS — M25532 Pain in left wrist: Secondary | ICD-10-CM

## 2023-04-22 ENCOUNTER — Telehealth: Payer: Self-pay | Admitting: Family Medicine

## 2023-04-22 NOTE — Telephone Encounter (Signed)
Patient has severe OSA with significant O2 drop while sleeping.  Recommend split night study with CPAP titration as he may need supplemental O2 as well.

## 2023-04-22 NOTE — Telephone Encounter (Signed)
Patient called in regards to Sleep Apnea test he was following up on test results please advise

## 2023-04-25 ENCOUNTER — Other Ambulatory Visit: Payer: Self-pay | Admitting: Family Medicine

## 2023-04-25 DIAGNOSIS — G4733 Obstructive sleep apnea (adult) (pediatric): Secondary | ICD-10-CM

## 2023-04-30 ENCOUNTER — Ambulatory Visit (AMBULATORY_SURGERY_CENTER): Payer: Medicare HMO | Admitting: Gastroenterology

## 2023-04-30 ENCOUNTER — Encounter: Payer: Self-pay | Admitting: Gastroenterology

## 2023-04-30 VITALS — BP 135/90 | HR 82 | Temp 98.4°F | Resp 15 | Ht 71.0 in | Wt 300.0 lb

## 2023-04-30 DIAGNOSIS — K635 Polyp of colon: Secondary | ICD-10-CM | POA: Diagnosis not present

## 2023-04-30 DIAGNOSIS — K5730 Diverticulosis of large intestine without perforation or abscess without bleeding: Secondary | ICD-10-CM | POA: Diagnosis not present

## 2023-04-30 DIAGNOSIS — F419 Anxiety disorder, unspecified: Secondary | ICD-10-CM | POA: Diagnosis not present

## 2023-04-30 DIAGNOSIS — K641 Second degree hemorrhoids: Secondary | ICD-10-CM | POA: Diagnosis not present

## 2023-04-30 DIAGNOSIS — D125 Benign neoplasm of sigmoid colon: Secondary | ICD-10-CM

## 2023-04-30 DIAGNOSIS — Z860100 Personal history of colon polyps, unspecified: Secondary | ICD-10-CM

## 2023-04-30 DIAGNOSIS — D122 Benign neoplasm of ascending colon: Secondary | ICD-10-CM

## 2023-04-30 DIAGNOSIS — Z09 Encounter for follow-up examination after completed treatment for conditions other than malignant neoplasm: Secondary | ICD-10-CM

## 2023-04-30 MED ORDER — SODIUM CHLORIDE 0.9 % IV SOLN
500.0000 mL | Freq: Once | INTRAVENOUS | Status: DC
Start: 2023-04-30 — End: 2023-04-30

## 2023-04-30 NOTE — Progress Notes (Signed)
GASTROENTEROLOGY PROCEDURE H&P NOTE   Primary Care Physician: Everrett Coombe, DO    Reason for Procedure:  Colon Cancer screening, colon polyp surveillance  Plan:    Colonoscopy  Patient is appropriate for endoscopic procedure(s) in the ambulatory ([facility]) setting.  The nature of the procedure, as well as the risks, benefits, and alternatives were carefully and thoroughly reviewed with the patient. Ample time for discussion and questions allowed. The patient understood, was satisfied, and agreed to proceed.     HPI: Nathaniel Montes is a 67 y.o. male who presents for colonoscopy for ongoing colon polyp surveillance.  No active GI symptoms.  No known family history of colon cancer or related malignancy.  Patient is otherwise without complaints or active issues today.  - Colonoscopy 2018 with 4 subcentimeter tubular adenomas - Colonoscopy 2011 with several subcentimeter hyperplastic polyps - Colonoscopy in 11/2019 with 8 subcentimeter adenomas scattered through the descending, transverse, and ascending colon, 12 mm semipedunculated sigmoid adenoma removed with hot snare.  Left-sided diverticulosis   Past Medical History:  Diagnosis Date   Anxiety 11/17/2012   Arthritis    LEFT WRIST   HYPERLIPIDEMIA 11/29/2009   HYPERTENSION 11/29/2009   Psoriasis 11/17/2012    Past Surgical History:  Procedure Laterality Date   APPENDECTOMY     COLONOSCOPY  08/16/2010   2008   LEG SURGERY Left    broken bone left lower leg, repaired   meniscus left knee Left    repair   POLYPECTOMY     REPLACEMENT TOTAL KNEE Right 12/2018   SHOULDER SURGERY Right    torn bicep, rotator cuff, ligament work injury    Prior to Admission medications   Medication Sig Start Date End Date Taking? Authorizing Provider  amLODipine (NORVASC) 5 MG tablet Take 1 tablet (5 mg total) by mouth daily. 01/15/23  Yes Everrett Coombe, DO  BABY ASPIRIN PO Take 81 mg by mouth daily.   Yes [provider]   lisinopril-hydrochlorothiazide (ZESTORETIC) 20-25 MG tablet Take 1 tablet by mouth daily. 07/12/22  Yes Everrett Coombe, DO  PARoxetine (PAXIL) 20 MG tablet Take 1 tablet (20 mg total) by mouth daily. 07/12/22  Yes Everrett Coombe, DO  rOPINIRole (REQUIP) 0.5 MG tablet TAKE 1/2 TO 1 TAB AT BEDTIME AS NEEDED FOR RESTLESS LEG 07/12/22  Yes Everrett Coombe, DO  simvastatin (ZOCOR) 80 MG tablet Take 1 tablet (80 mg total) by mouth daily. 07/12/22  Yes Everrett Coombe, DO  meloxicam (MOBIC) 15 MG tablet TAKE 1 TABLET (15 MG TOTAL) BY MOUTH DAILY. 04/15/23   Everrett Coombe, DO  Risankizumab-rzaa [facility]) 150 MG/ML SOSY Inject into the skin. ONCE EVERY  3 MONTHS    [provider]    Current Outpatient Medications  Medication Sig Dispense Refill   amLODipine (NORVASC) 5 MG tablet Take 1 tablet (5 mg total) by mouth daily. 90 tablet 1   BABY ASPIRIN PO Take 81 mg by mouth daily.     lisinopril-hydrochlorothiazide (ZESTORETIC) 20-25 MG tablet Take 1 tablet by mouth daily. 90 tablet 3   PARoxetine (PAXIL) 20 MG tablet Take 1 tablet (20 mg total) by mouth daily. 90 tablet 3   rOPINIRole (REQUIP) 0.5 MG tablet TAKE 1/2 TO 1 TAB AT BEDTIME AS NEEDED FOR RESTLESS LEG 90 tablet 3   simvastatin (ZOCOR) 80 MG tablet Take 1 tablet (80 mg total) by mouth daily. 90 tablet 3   meloxicam (MOBIC) 15 MG tablet TAKE 1 TABLET (15 MG TOTAL) BY MOUTH DAILY. 30 tablet  0   Risankizumab-rzaa (SKYRIZI) 150 MG/ML SOSY Inject into the skin. ONCE EVERY  3 MONTHS     Current Facility-Administered Medications  Medication Dose Route Frequency Provider Last Rate Last Admin   0.9 %  sodium chloride infusion  500 mL Intravenous Once Bentlee Drier V, DO        Allergies as of 04/30/2023   (No Known Allergies)    Family History  Problem Relation Age of Onset   Bladder Cancer Mother    Hypertension Father    Healthy Sister    Healthy Sister    Healthy Brother    Colon cancer Neg Hx    Colon polyps Neg Hx     Esophageal cancer Neg Hx    Rectal cancer Neg Hx    Stomach cancer Neg Hx    Crohn's disease Neg Hx    Ulcerative colitis Neg Hx     Social History   Socioeconomic History   Marital status: Married    Spouse name: Tracie   Number of children: 2   Years of education: 12   Highest education level: 12th grade  Occupational History    Comment: Retired  Tobacco Use   Smoking status: Former    Types: Cigarettes    Quit date: 1996    Years since quitting: 28.4   Smokeless tobacco: Never  Vaping Use   Vaping Use: Never used  Substance and Sexual Activity   Alcohol use: Yes    Comment: 2 case of beer per week   Drug use: No   Sexual activity: Not on file  Other Topics Concern   Not on file  Social History Narrative   Lives with wife and his youngest son. He hasn't been able to do much exercise lately. He is starting to go camping soon.   Social Determinants of Health   Financial Resource Strain: Low Risk  (06/20/2021)   Overall Financial Resource Strain (CARDIA)    Difficulty of Paying Living Expenses: Not hard at all  Food Insecurity: No Food Insecurity (06/20/2021)   Hunger Vital Sign    Worried About Running Out of Food in the Last Year: Never true    Ran Out of Food in the Last Year: Never true  Transportation Needs: No Transportation Needs (06/20/2021)   PRAPARE - Administrator, Civil Service (Medical): No    Lack of Transportation (Non-Medical): No  Physical Activity: Inactive (06/20/2021)   Exercise Vital Sign    Days of Exercise per Week: 0 days    Minutes of Exercise per Session: 0 min  Stress: No Stress Concern Present (06/20/2021)   [employer] of Occupational Health - Occupational Stress Questionnaire    Feeling of Stress : Not at all  Social Connections: Moderately Isolated (06/20/2021)   Social Connection and Isolation Panel [NHANES]    Frequency of Communication with Friends and Family: More than three times a week    Frequency of Social  Gatherings with Friends and Family: Twice a week    Attends Religious Services: Never    Database administrator or Organizations: No    Attends Banker Meetings: Never    Marital Status: Married  Catering manager Violence: Not At Risk (06/20/2021)   Humiliation, Afraid, Rape, and Kick questionnaire    Fear of Current or Ex-Partner: No    Emotionally Abused: No    Physically Abused: No    Sexually Abused: No    Physical Exam: Vital signs in last  24 hours: @BP  (!) 164/100   Pulse 96   Temp 98.4 F (36.9 C)   Ht 5\' 11"  (1.803 m)   Wt 300 lb (136.1 kg)   SpO2 92%   BMI 41.84 kg/m  GEN: NAD EYE: Sclerae anicteric ENT: MMM CV: Non-tachycardic Pulm: CTA b/l GI: Soft, NT/ND NEURO:  Alert & Oriented x 3   Doristine Locks, DO [facility]   04/30/2023 8:30 AM

## 2023-04-30 NOTE — Progress Notes (Signed)
Called to room to assist during endoscopic procedure.  Patient ID and intended procedure confirmed with present staff. Received instructions for my participation in the procedure from the performing physician.  

## 2023-04-30 NOTE — Progress Notes (Signed)
Vss nad trans to pacu put on 3 LPM o2 vis mask

## 2023-04-30 NOTE — Op Note (Signed)
[facility] Patient Name: Nathaniel Montes Procedure Date: 04/30/2023 8:30 AM MRN: [id number] Endoscopist: Doristine Locks , MD, [id number] Age: 67 Referring MD:  Date of Birth: 08/15/1956 Gender: Male Account #: [id number] Procedure:                Colonoscopy Indications:              Surveillance: Personal history of adenomatous                            polyps on last colonoscopy > 3 years ago                           -Colonoscopy 2018 with 4 subcentimeter tubular                            adenomas                           -Colonoscopy 2011 with several subcentimeter                            hyperplastic polyps                           -Colonoscopy in 11/2019 with 8 subcentimeter                            adenomas scattered through the descending,                            transverse, and ascending colon, 12 mm                            semipedunculated sigmoid adenoma removed with hot                            snare. Left-sided diverticulosis Medicines:                Monitored Anesthesia Care Procedure:                Pre-Anesthesia Assessment:                           - Prior to the procedure, a History and Physical                            was performed, and patient medications and                            allergies were reviewed. The patient's tolerance of                            previous anesthesia was also reviewed. The risks                            and benefits of the procedure and the sedation  options and risks were discussed with the patient.                            All questions were answered, and informed consent                            was obtained. Prior Anticoagulants: The patient has                            taken no anticoagulant or antiplatelet agents. ASA                            Grade Assessment: III - A patient with severe                            systemic disease. After reviewing the risks  and                            benefits, the patient was deemed in satisfactory                            condition to undergo the procedure.                           After obtaining informed consent, the colonoscope                            was passed under direct vision. Throughout the                            procedure, the patient's blood pressure, pulse, and                            oxygen saturations were monitored continuously. The                            Olympus CF-HQ190L [id number]) Colonoscope was                            introduced through the anus and advanced to the the                            cecum, identified by appendiceal orifice and                            ileocecal valve. The colonoscopy was performed                            without difficulty. The patient tolerated the                            procedure well. The quality of the bowel  preparation was good. The ileocecal valve,                            appendiceal orifice, and rectum were photographed. Scope In: 8:35:39 AM Scope Out: 8:56:53 AM Scope Withdrawal Time: 0 hours 13 minutes 27 seconds  Total Procedure Duration: 0 hours 21 minutes 14 seconds  Findings:                 The perianal and digital rectal examinations were                            normal.                           A 6 mm polyp was found in the distal ascending                            colon. The polyp was sessile. The polyp was removed                            with a cold snare. Resection and retrieval were                            complete. Estimated blood loss was minimal.                           A 3 mm polyp was found in the sigmoid colon. The                            polyp was sessile. The polyp was removed with a                            cold snare. Resection and retrieval were complete.                            Estimated blood loss was minimal.                            Multiple small-mouthed diverticula were found in                            the sigmoid colon.                           Non-bleeding internal hemorrhoids were found during                            retroflexion. The hemorrhoids were small.                           The ascending colon revealed moderately excessive                            looping. Advancing the scope required using manual  pressure. Complications:            No immediate complications. Estimated Blood Loss:     Estimated blood loss was minimal. Impression:               - One 6 mm polyp in the distal ascending colon,                            removed with a cold snare. Resected and retrieved.                           - One 3 mm polyp in the sigmoid colon, removed with                            a cold snare. Resected and retrieved.                           - Diverticulosis in the sigmoid colon.                           - Non-bleeding internal hemorrhoids.                           - There was significant looping of the colon.                           - The GI Genius (intelligent endoscopy module),                            computer-aided polyp detection system powered by AI                            was utilized to detect colorectal polyps through                            enhanced visualization during colonoscopy. Recommendation:           - Patient has a contact number available for                            emergencies. The signs and symptoms of potential                            delayed complications were discussed with the                            patient. Return to normal activities tomorrow.                            Written discharge instructions were provided to the                            patient.                           - Resume previous diet.                           -  Continue present medications.                           - Await pathology results.                            - Repeat colonoscopy in 3 - 5 years for                            surveillance based on pathology results.                           - Return to GI office PRN.                           - Follow-up in the Pulmonary Clinic for sleep apnea. Doristine Locks, MD 04/30/2023 9:04:16 AM

## 2023-04-30 NOTE — Patient Instructions (Signed)
Handouts on hemorrhoids, polyps, and diverticulosis given to patient Await pathology results Resume previous diet and continue present medications Repeat colonoscopy in 3-5 years for surveillance based off of pathology results  Follow up with pulmonology for sleep apnea    YOU HAD AN ENDOSCOPIC PROCEDURE TODAY AT THE [facility]:   Refer to the procedure report that was given to you for any specific questions about what was found during the examination.  If the procedure report does not answer your questions, please call your gastroenterologist to clarify.  If you requested that your care partner not be given the details of your procedure findings, then the procedure report has been included in a sealed envelope for you to review at your convenience later.  YOU SHOULD EXPECT: Some feelings of bloating in the abdomen. Passage of more gas than usual.  Walking can help get rid of the air that was put into your GI tract during the procedure and reduce the bloating. If you had a lower endoscopy (such as a colonoscopy or flexible sigmoidoscopy) you may notice spotting of blood in your stool or on the toilet paper. If you underwent a bowel prep for your procedure, you may not have a normal bowel movement for a few days.  Please Note:  You might notice some irritation and congestion in your nose or some drainage.  This is from the oxygen used during your procedure.  There is no need for concern and it should clear up in a day or so.  SYMPTOMS TO REPORT IMMEDIATELY:  Following lower endoscopy (colonoscopy or flexible sigmoidoscopy):  Excessive amounts of blood in the stool  Significant tenderness or worsening of abdominal pains  Swelling of the abdomen that is new, acute  Fever of 100F or higher   For urgent or emergent issues, a gastroenterologist can be reached at any hour by calling [phone]. Do not use MyChart messaging for urgent concerns.    DIET:  We do recommend a small  meal at first, but then you may proceed to your regular diet.  Drink plenty of fluids but you should avoid alcoholic beverages for 24 hours.  ACTIVITY:  You should plan to take it easy for the rest of today and you should NOT DRIVE or use heavy machinery until tomorrow (because of the sedation medicines used during the test).    FOLLOW UP: Our staff will call the number listed on your records the next business day following your procedure.  We will call around 7:15- 8:00 am to check on you and address any questions or concerns that you may have regarding the information given to you following your procedure. If we do not reach you, we will leave a message.     If any biopsies were taken you will be contacted by phone or by letter within the next 1-3 weeks.  Please call us at [phone] if you have not heard about the biopsies in 3 weeks.    SIGNATURES/CONFIDENTIALITY: You and/or your care partner have signed paperwork which will be entered into your electronic medical record.  These signatures attest to the fact that that the information above on your After Visit Summary has been reviewed and is understood.  Full responsibility of the confidentiality of this discharge information lies with you and/or your care-partner.

## 2023-04-30 NOTE — Progress Notes (Signed)
Pt's states no medical or surgical changes since previsit or office visit. 

## 2023-04-30 NOTE — Progress Notes (Signed)
#  9 oral airway nd#7 nasal placed  pt with SEVERE sleep apnea

## 2023-05-01 ENCOUNTER — Telehealth: Payer: Self-pay | Admitting: *Deleted

## 2023-05-01 NOTE — Telephone Encounter (Signed)
Follow up call attempt.  LVM to call if any questions or concerns. 

## 2023-05-13 NOTE — Telephone Encounter (Signed)
CPAP titration sleep study orders have been faxed to [facility] at [phone]. PA is currently pending per notes.

## 2023-05-16 ENCOUNTER — Encounter: Payer: Self-pay | Admitting: Family Medicine

## 2023-05-17 NOTE — Telephone Encounter (Signed)
Please contact [facility] and see what the hold up regarding insurance is.   Thanks!

## 2023-05-24 NOTE — Progress Notes (Signed)
error 

## 2023-05-24 NOTE — Progress Notes (Signed)
Error

## 2023-06-12 ENCOUNTER — Ambulatory Visit (HOSPITAL_BASED_OUTPATIENT_CLINIC_OR_DEPARTMENT_OTHER): Payer: Medicare HMO | Attending: Family Medicine | Admitting: Internal Medicine

## 2023-06-12 VITALS — Ht 70.0 in | Wt 300.0 lb

## 2023-06-12 DIAGNOSIS — G4733 Obstructive sleep apnea (adult) (pediatric): Secondary | ICD-10-CM | POA: Insufficient documentation

## 2023-06-21 ENCOUNTER — Telehealth: Payer: Self-pay | Admitting: Family Medicine

## 2023-06-21 NOTE — Telephone Encounter (Signed)
Patient is following up on sleep study results Please advise [phone]

## 2023-06-22 DIAGNOSIS — G4733 Obstructive sleep apnea (adult) (pediatric): Secondary | ICD-10-CM | POA: Diagnosis not present

## 2023-06-22 NOTE — Procedures (Signed)
   Patient Name: Nathaniel Montes, Nathaniel Montes Date: 06/12/2023 Gender: Male D.O.B: 12/14/1955 Age (years): 72 Referring Provider: Everrett Coombe MD Height (inches): 70 Interpreting Physician: Jetty Duhamel MD, ABSM Weight (lbs): 300 RPSGT: Ulyess Mort BMI: 43 MRN: [id number] Neck Size: 21.50  CLINICAL INFORMATION The patient is referred for a BiPAP titration to treat sleep apnea.  Date of NPSG, Split Night or HST:   HST 03/27/23  AHI 118.9/ hr, desaturation to 68%, body weight 300 lbs  SLEEP STUDY TECHNIQUE As per the AASM Manual for the Scoring of Sleep and Associated Events v2.3 (April 2016) with a hypopnea requiring 4% desaturations.  The channels recorded and monitored were frontal, central and occipital EEG, electrooculogram (EOG), submentalis EMG (chin), nasal and oral airflow, thoracic and abdominal wall motion, anterior tibialis EMG, snore microphone, electrocardiogram, and pulse oximetry. Bilevel positive airway pressure (BPAP) was initiated at the beginning of the study and titrated to treat sleep-disordered breathing.  MEDICATIONS Medications self-administered by patient taken the night of the study : none reported  RESPIRATORY PARAMETERS Optimal IPAP Pressure (cm): 26 AHI at Optimal Pressure (/hr) 4.6 Optimal EPAP Pressure (cm): 22   Overall Minimal O2 (%): 67.0 Minimal O2 at Optimal Pressure (%): 89.0 SLEEP ARCHITECTURE Start Time: 10:05:18 PM Stop Time: 4:21:42 AM Total Time (min): 376.4 Total Sleep Time (min): 177 Sleep Latency (min): 74.9 Sleep Efficiency (%): 47.0% REM Latency (min): 116.5 WASO (min): 124.5 Stage N1 (%): 25.1% Stage N2 (%): 39.8% Stage N3 (%): 0.0% Stage R (%): 35 Supine (%): 100.00 Arousal Index (/hr): 35.9   CARDIAC DATA The 2 lead EKG demonstrated sinus rhythm. The mean heart rate was 59.7 beats per minute. Other EKG findings include: None.  LEG MOVEMENT DATA The total Periodic Limb Movements of Sleep (PLMS) were 0. The PLMS index was 0.0. A  PLMS index of <15 is considered normal in adults.  IMPRESSIONS - CPAP provided inadequate control at tolerated pressures and was changed to BIPAP titration. - An optimal BIPAP pressure was selected for this patient ( 25/ 21, PS 3 / cm of water). Alternatively, discuss autoBIPAP with DME provider. Management consultation available if needed. - Severe oxygen desaturations were observed during this titration (min O2 = 67.0%). Minimum O2 saturation on BIPAP 25/ 21 was 90%. - The patient snored with moderate snoring volume. - No cardiac abnormalities were observed during this study. - Clinically significant periodic limb movements were not noted during this study. Arousals associated with PLMs were rare.  DIAGNOSIS - Obstructive Sleep Apnea (G47.33)  RECOMMENDATIONS - Trial of BiPAP therapy on 25/ 21 cm H2O with a Large size [employer]kel Full Face Evora Full mask and heated humidification. - Be careful with alcohol, sedatives and other CNS depressants that may worsen sleep apnea and disrupt normal sleep architecture. - Sleep hygiene should be reviewed to assess factors that may improve sleep quality. - Weight management and regular exercise should be initiated or continued.  [Electronically signed] 06/22/2023 12:20 PM  Jetty Duhamel MD, ABSM Diplomate, American Board of Sleep Medicine NPI: [id number]                          Jetty Duhamel Diplomate, American Board of Sleep Medicine  ELECTRONICALLY SIGNED ON:  06/22/2023, 12:14 PM [facility] PH: [phone]   FX: [phone] ACCREDITED BY THE AMERICAN ACADEMY OF SLEEP MEDICINE

## 2023-06-26 ENCOUNTER — Encounter: Payer: Self-pay | Admitting: Family Medicine

## 2023-06-26 ENCOUNTER — Other Ambulatory Visit: Payer: Self-pay | Admitting: Family Medicine

## 2023-06-26 MED ORDER — AMBULATORY NON FORMULARY MEDICATION: 0 refills | Status: AC

## 2023-06-26 NOTE — Telephone Encounter (Signed)
Sleep study is recommending Bipap.  I am sending orders over for equipment.  He should hear from DME company soon.

## 2023-06-26 NOTE — Telephone Encounter (Signed)
Patient called again for an update on the sleep study results and to see about getting the mask for sleeping.  Patient stated he has not heard anything in 2 weeks.  Please call him back at [phone].

## 2023-06-27 NOTE — Telephone Encounter (Signed)
Called patient and informed him that orders for the equipment has been placed and to expect a call from DME soon.

## 2023-07-01 ENCOUNTER — Telehealth: Payer: Self-pay

## 2023-07-01 NOTE — Telephone Encounter (Signed)
Sent Rx to DME with demo sheet, insurance, OV notes, and sleep study meds.

## 2023-07-03 ENCOUNTER — Other Ambulatory Visit: Payer: Self-pay | Admitting: Family Medicine

## 2023-07-03 DIAGNOSIS — M25532 Pain in left wrist: Secondary | ICD-10-CM

## 2023-07-08 DIAGNOSIS — Z87891 Personal history of nicotine dependence: Secondary | ICD-10-CM | POA: Diagnosis not present

## 2023-07-08 DIAGNOSIS — Z809 Family history of malignant neoplasm, unspecified: Secondary | ICD-10-CM | POA: Diagnosis not present

## 2023-07-08 DIAGNOSIS — Z8249 Family history of ischemic heart disease and other diseases of the circulatory system: Secondary | ICD-10-CM | POA: Diagnosis not present

## 2023-07-08 DIAGNOSIS — Z008 Encounter for other general examination: Secondary | ICD-10-CM | POA: Diagnosis not present

## 2023-07-08 DIAGNOSIS — Z791 Long term (current) use of non-steroidal anti-inflammatories (NSAID): Secondary | ICD-10-CM | POA: Diagnosis not present

## 2023-07-08 DIAGNOSIS — M1990 Unspecified osteoarthritis, unspecified site: Secondary | ICD-10-CM | POA: Diagnosis not present

## 2023-07-08 DIAGNOSIS — Z6841 Body Mass Index (BMI) 40.0 and over, adult: Secondary | ICD-10-CM | POA: Diagnosis not present

## 2023-07-08 DIAGNOSIS — L400 Psoriasis vulgaris: Secondary | ICD-10-CM | POA: Diagnosis not present

## 2023-07-08 DIAGNOSIS — E785 Hyperlipidemia, unspecified: Secondary | ICD-10-CM | POA: Diagnosis not present

## 2023-07-08 DIAGNOSIS — G4733 Obstructive sleep apnea (adult) (pediatric): Secondary | ICD-10-CM | POA: Diagnosis not present

## 2023-07-08 DIAGNOSIS — Z823 Family history of stroke: Secondary | ICD-10-CM | POA: Diagnosis not present

## 2023-07-08 DIAGNOSIS — I10 Essential (primary) hypertension: Secondary | ICD-10-CM | POA: Diagnosis not present

## 2023-07-09 ENCOUNTER — Other Ambulatory Visit: Payer: Self-pay | Admitting: Family Medicine

## 2023-07-10 DIAGNOSIS — G4733 Obstructive sleep apnea (adult) (pediatric): Secondary | ICD-10-CM | POA: Diagnosis not present

## 2023-07-16 ENCOUNTER — Ambulatory Visit (INDEPENDENT_AMBULATORY_CARE_PROVIDER_SITE_OTHER): Payer: Medicare HMO | Admitting: Family Medicine

## 2023-07-16 ENCOUNTER — Encounter: Payer: Self-pay | Admitting: Family Medicine

## 2023-07-16 VITALS — BP 143/79 | HR 63 | Ht 70.0 in | Wt 305.0 lb

## 2023-07-16 DIAGNOSIS — E785 Hyperlipidemia, unspecified: Secondary | ICD-10-CM

## 2023-07-16 DIAGNOSIS — G4733 Obstructive sleep apnea (adult) (pediatric): Secondary | ICD-10-CM | POA: Diagnosis not present

## 2023-07-16 DIAGNOSIS — I10 Essential (primary) hypertension: Secondary | ICD-10-CM

## 2023-07-16 DIAGNOSIS — M25532 Pain in left wrist: Secondary | ICD-10-CM

## 2023-07-16 DIAGNOSIS — R7303 Prediabetes: Secondary | ICD-10-CM

## 2023-07-16 DIAGNOSIS — F419 Anxiety disorder, unspecified: Secondary | ICD-10-CM | POA: Diagnosis not present

## 2023-07-16 DIAGNOSIS — Z125 Encounter for screening for malignant neoplasm of prostate: Secondary | ICD-10-CM

## 2023-07-16 DIAGNOSIS — L409 Psoriasis, unspecified: Secondary | ICD-10-CM

## 2023-07-16 MED ORDER — ROPINIROLE HCL 0.5 MG PO TABS: ORAL_TABLET | ORAL | 3 refills | Status: DC

## 2023-07-16 MED ORDER — AMLODIPINE BESYLATE 5 MG PO TABS: 5.0000 mg | ORAL_TABLET | Freq: Every day | ORAL | 3 refills | Status: DC

## 2023-07-16 MED ORDER — MELOXICAM 15 MG PO TABS: 15.0000 mg | ORAL_TABLET | Freq: Every day | ORAL | 1 refills | Status: DC

## 2023-07-16 MED ORDER — SIMVASTATIN 80 MG PO TABS: 80.0000 mg | ORAL_TABLET | Freq: Every day | ORAL | 3 refills | Status: DC

## 2023-07-16 MED ORDER — LISINOPRIL-HYDROCHLOROTHIAZIDE 20-25 MG PO TABS
1.0000 | ORAL_TABLET | Freq: Every day | ORAL | 3 refills | Status: DC
Start: 2023-07-16 — End: 2024-01-20

## 2023-07-16 NOTE — Assessment & Plan Note (Signed)
Managed by dermatology.  Stable at this time.

## 2023-07-16 NOTE — Progress Notes (Signed)
Nathaniel Montes - 67 y.o. male MRN [id number]  Date of birth: 1956/11/17  Subjective Chief Complaint  Patient presents with   Hypertension    HPI Nathaniel Montes is a 67 y.o. male here today for follow up visit.  Continues on lisinopril/hydrochlorothiazide and amlodipine for management of HTN.  Doing well with this at current strength.  Denies side effects at current strength.  He has not had chest pain, shortness of breath, palpitations, headache or vision changes.  Recently started on CPAP therapy.  He reports that he is tolerating this well so far.  RLS stable with current strength of Requip.  Tolerating simvastatin well at current strength.      ROS:  A comprehensive ROS was completed and negative except as noted per HPI  No Known Allergies  Past Medical History:  Diagnosis Date   Anxiety 11/17/2012   Arthritis    LEFT WRIST   HYPERLIPIDEMIA 11/29/2009   HYPERTENSION 11/29/2009   Psoriasis 11/17/2012    Past Surgical History:  Procedure Laterality Date   APPENDECTOMY     COLONOSCOPY  08/16/2010   2008   LEG SURGERY Left    broken bone left lower leg, repaired   meniscus left knee Left    repair   POLYPECTOMY     REPLACEMENT TOTAL KNEE Right 12/2018   SHOULDER SURGERY Right    torn bicep, rotator cuff, ligament work injury    Social History   Socioeconomic History   Marital status: Married    Spouse name: Tracie   Number of children: 2   Years of education: 12   Highest education level: 12th grade  Occupational History    Comment: Retired  Tobacco Use   Smoking status: Former    Current packs/day: 0.00    Types: Cigarettes    Quit date: 1996    Years since quitting: 28.6   Smokeless tobacco: Never  Vaping Use   Vaping status: Never Used  Substance and Sexual Activity   Alcohol use: Yes    Comment: 2 case of beer per week   Drug use: No   Sexual activity: Not on file  Other Topics Concern   Not on file  Social History Narrative   Lives with  wife and his youngest son. He hasn't been able to do much exercise lately. He is starting to go camping soon.   Social Determinants of Health   Financial Resource Strain: Low Risk  (06/20/2021)   Overall Financial Resource Strain (CARDIA)    Difficulty of Paying Living Expenses: Not hard at all  Food Insecurity: No Food Insecurity (06/20/2021)   Hunger Vital Sign    Worried About Running Out of Food in the Last Year: Never true    Ran Out of Food in the Last Year: Never true  Transportation Needs: No Transportation Needs (06/20/2021)   PRAPARE - Administrator, Civil Service (Medical): No    Lack of Transportation (Non-Medical): No  Physical Activity: Inactive (06/20/2021)   Exercise Vital Sign    Days of Exercise per Week: 0 days    Minutes of Exercise per Session: 0 min  Stress: No Stress Concern Present (06/20/2021)   [employer] of Occupational Health - Occupational Stress Questionnaire    Feeling of Stress : Not at all  Social Connections: Moderately Isolated (06/20/2021)   Social Connection and Isolation Panel [NHANES]    Frequency of Communication with Friends and Family: More than three times a week  Frequency of Social Gatherings with Friends and Family: Twice a week    Attends Religious Services: Never    Database administrator or Organizations: No    Attends Engineer, structural: Never    Marital Status: Married    Family History  Problem Relation Age of Onset   Bladder Cancer Mother    Hypertension Father    Healthy Sister    Healthy Sister    Healthy Brother    Colon cancer Neg Hx    Colon polyps Neg Hx    Esophageal cancer Neg Hx    Rectal cancer Neg Hx    Stomach cancer Neg Hx    Crohn's disease Neg Hx    Ulcerative colitis Neg Hx     Health Maintenance  Topic Date Due   Medicare Annual Wellness (AWV)  08/16/2023 (Originally 06/20/2022)   COVID-19 Vaccine (4 - 2023-24 season) 09/17/2023 (Originally 08/03/2022)   INFLUENZA VACCINE   03/02/2024 (Originally 07/04/2023)   DTaP/Tdap/Td (3 - Td or Tdap) 10/18/2027   Colonoscopy  04/29/2028   Pneumonia Vaccine 5+ Years old  Completed   Zoster Vaccines- Shingrix  Completed   HPV VACCINES  Aged Out   Hepatitis C Screening  Discontinued     ----------------------------------------------------------------------------------------------------------------------------------------------------------------------------------------------------------------- Physical Exam BP (!) 143/79 (BP Location: Left Arm, Patient Position: Sitting, Cuff Size: Large)   Pulse 63   Ht 5\' 10"  (1.778 m)   Wt (!) 305 lb (138.3 kg)   SpO2 94%   BMI 43.76 kg/m   Physical Exam Constitutional:      Appearance: Normal appearance.  HENT:     Head: Normocephalic and atraumatic.  Eyes:     General: No scleral icterus. Cardiovascular:     Rate and Rhythm: Normal rate and regular rhythm.  Pulmonary:     Effort: Pulmonary effort is normal.     Breath sounds: Normal breath sounds.  Neurological:     Mental Status: He is alert.  Psychiatric:        Mood and Affect: Mood normal.        Behavior: Behavior normal.     ------------------------------------------------------------------------------------------------------------------------------------------------------------------------------------------------------------------- Assessment and Plan  Essential hypertension BP is fairly well controlled. Low sodium diet encouraged.  Weight loss encouraged.  Continue current medications.   OSA (obstructive sleep apnea) Recently started on CPAP, doing well.  He will f/u in 60 days for compliance monitoring.   Anxiety Remains well controlled with Paxil.  Continue at current strength.   Psoriasis Managed by dermatology.  Stable at this time.   Hyperlipidemia with target low density lipoprotein (LDL) cholesterol less than 130 mg/dL Doing well with simvastatin.   Prediabetes Updated A1c ordered.     Meds ordered this encounter  Medications   meloxicam (MOBIC) 15 MG tablet    Sig: Take 1 tablet (15 mg total) by mouth daily.    Dispense:  90 tablet    Refill:  1   amLODipine (NORVASC) 5 MG tablet    Sig: Take 1 tablet (5 mg total) by mouth daily.    Dispense:  90 tablet    Refill:  3   lisinopril-hydrochlorothiazide (ZESTORETIC) 20-25 MG tablet    Sig: Take 1 tablet by mouth daily.    Dispense:  90 tablet    Refill:  3   rOPINIRole (REQUIP) 0.5 MG tablet    Sig: TAKE 1/2 TO 1 TAB AT BEDTIME AS NEEDED FOR RESTLESS LEG    Dispense:  90 tablet    Refill:  3   simvastatin (ZOCOR) 80 MG tablet    Sig: Take 1 tablet (80 mg total) by mouth daily.    Dispense:  90 tablet    Refill:  3    Return in about 2 months (around 09/15/2023) for F/u CPAP.    This visit occurred during the SARS-CoV-2 public health emergency.  Safety protocols were in place, including screening questions prior to the visit, additional usage of staff PPE, and extensive cleaning of exam room while observing appropriate contact time as indicated for disinfecting solutions.

## 2023-07-16 NOTE — Assessment & Plan Note (Signed)
BP is fairly well controlled. Low sodium diet encouraged.  Weight loss encouraged.  Continue current medications.

## 2023-07-16 NOTE — Assessment & Plan Note (Signed)
Remains well controlled with Paxil.  Continue at current strength.

## 2023-07-16 NOTE — Assessment & Plan Note (Signed)
Recently started on CPAP, doing well.  He will f/u in 60 days for compliance monitoring.

## 2023-07-16 NOTE — Assessment & Plan Note (Signed)
Updated A1c ordered. ?

## 2023-07-16 NOTE — Assessment & Plan Note (Signed)
Doing well with simvastatin

## 2023-07-18 ENCOUNTER — Other Ambulatory Visit: Payer: Self-pay | Admitting: Family Medicine

## 2023-07-18 DIAGNOSIS — R972 Elevated prostate specific antigen [PSA]: Secondary | ICD-10-CM

## 2023-08-10 DIAGNOSIS — G4733 Obstructive sleep apnea (adult) (pediatric): Secondary | ICD-10-CM | POA: Diagnosis not present

## 2023-09-09 DIAGNOSIS — G4733 Obstructive sleep apnea (adult) (pediatric): Secondary | ICD-10-CM | POA: Diagnosis not present

## 2023-09-17 ENCOUNTER — Encounter: Payer: Self-pay | Admitting: Family Medicine

## 2023-09-17 ENCOUNTER — Telehealth: Payer: Self-pay | Admitting: Family Medicine

## 2023-09-17 ENCOUNTER — Ambulatory Visit (INDEPENDENT_AMBULATORY_CARE_PROVIDER_SITE_OTHER): Payer: Medicare HMO | Admitting: Family Medicine

## 2023-09-17 ENCOUNTER — Other Ambulatory Visit: Payer: Self-pay | Admitting: Family Medicine

## 2023-09-17 VITALS — BP 143/83 | HR 74 | Ht 70.0 in | Wt 322.0 lb

## 2023-09-17 DIAGNOSIS — R972 Elevated prostate specific antigen [PSA]: Secondary | ICD-10-CM | POA: Insufficient documentation

## 2023-09-17 DIAGNOSIS — R7303 Prediabetes: Secondary | ICD-10-CM

## 2023-09-17 DIAGNOSIS — I10 Essential (primary) hypertension: Secondary | ICD-10-CM

## 2023-09-17 DIAGNOSIS — E785 Hyperlipidemia, unspecified: Secondary | ICD-10-CM | POA: Diagnosis not present

## 2023-09-17 DIAGNOSIS — F419 Anxiety disorder, unspecified: Secondary | ICD-10-CM

## 2023-09-17 DIAGNOSIS — Z23 Encounter for immunization: Secondary | ICD-10-CM | POA: Diagnosis not present

## 2023-09-17 MED ORDER — METHYLPREDNISOLONE 4 MG PO TBPK: ORAL_TABLET | ORAL | 0 refills | Status: DC

## 2023-09-17 NOTE — Assessment & Plan Note (Signed)
BP improved on second check.  Recommend home monitoring.  Continue current medication.

## 2023-09-17 NOTE — Assessment & Plan Note (Signed)
Remains well controlled with Paxil.  Continue at current strength.

## 2023-09-17 NOTE — Assessment & Plan Note (Signed)
A1c has increased further.  Encouraged dietary changes.  Repeat  A1c in 3 months.

## 2023-09-17 NOTE — Telephone Encounter (Signed)
Patient was seen today he stated you discussed prescribing him an antiflamtory for shoulder and knee pain   [facility] on [address] [place] [address] Phone [phone]

## 2023-09-17 NOTE — Assessment & Plan Note (Signed)
Recently started on CPAP, doing well.  He is getting good benefit from this and tolerating well.

## 2023-09-17 NOTE — Assessment & Plan Note (Signed)
Doing well with simvastatin.

## 2023-09-17 NOTE — Progress Notes (Signed)
Nathaniel Montes - 67 y.o. male MRN [id number]  Date of birth: October 01, 1956  Subjective Chief Complaint  Patient presents with   Medical Management of Chronic Issues    2 month fup on htn     HPI Nathaniel Montes is a 67 y.o. male here today for follow up visit.    He reports that he is doing well.  He is excited about his first grandchild who should arrive in the next day or so.   He continues on lisinopril/hydrochlorothiazide and amlodipine for management of HTN.  BP is elevated on initial check today.  He denies side effects from medication at current strength.  He has not had chest pain, shortness of breath, palpitations, headache or vision changes.   Remains on paxil for anxeity.  This is working well for him.   He is using CPAP regularly.  He is tolerating this quite well.  He is getting good benefit from this.  He is using nightly.    Blood sugars elevated on recent labs with A1c up to 6.5%.  He admits he could do better with diet.    ROS:  A comprehensive ROS was completed and negative except as noted per HPI  No Known Allergies  Past Medical History:  Diagnosis Date   Anxiety 11/17/2012   Arthritis    LEFT WRIST   HYPERLIPIDEMIA 11/29/2009   HYPERTENSION 11/29/2009   Psoriasis 11/17/2012    Past Surgical History:  Procedure Laterality Date   APPENDECTOMY     COLONOSCOPY  08/16/2010   2008   LEG SURGERY Left    broken bone left lower leg, repaired   meniscus left knee Left    repair   POLYPECTOMY     REPLACEMENT TOTAL KNEE Right 12/2018   SHOULDER SURGERY Right    torn bicep, rotator cuff, ligament work injury    Social History   Socioeconomic History   Marital status: Married    Spouse name: Tracie   Number of children: 2   Years of education: 12   Highest education level: 12th grade  Occupational History    Comment: Retired  Tobacco Use   Smoking status: Former    Current packs/day: 0.00    Types: Cigarettes    Quit date: 1996    Years since  quitting: 28.8   Smokeless tobacco: Never  Vaping Use   Vaping status: Never Used  Substance and Sexual Activity   Alcohol use: Yes    Comment: 2 case of beer per week   Drug use: No   Sexual activity: Not on file  Other Topics Concern   Not on file  Social History Narrative   Lives with wife and his youngest son. He hasn't been able to do much exercise lately. He is starting to go camping soon.   Social Determinants of Health   Financial Resource Strain: Low Risk  (06/20/2021)   Overall Financial Resource Strain (CARDIA)    Difficulty of Paying Living Expenses: Not hard at all  Food Insecurity: No Food Insecurity (06/20/2021)   Hunger Vital Sign    Worried About Running Out of Food in the Last Year: Never true    Ran Out of Food in the Last Year: Never true  Transportation Needs: No Transportation Needs (06/20/2021)   PRAPARE - Administrator, Civil Service (Medical): No    Lack of Transportation (Non-Medical): No  Physical Activity: Inactive (06/20/2021)   Exercise Vital Sign    Days of Exercise  per Week: 0 days    Minutes of Exercise per Session: 0 min  Stress: No Stress Concern Present (06/20/2021)   [employer] of Occupational Health - Occupational Stress Questionnaire    Feeling of Stress : Not at all  Social Connections: Moderately Isolated (06/20/2021)   Social Connection and Isolation Panel [NHANES]    Frequency of Communication with Friends and Family: More than three times a week    Frequency of Social Gatherings with Friends and Family: Twice a week    Attends Religious Services: Never    Database administrator or Organizations: No    Attends Engineer, structural: Never    Marital Status: Married    Family History  Problem Relation Age of Onset   Bladder Cancer Mother    Hypertension Father    Healthy Sister    Healthy Sister    Healthy Brother    Colon cancer Neg Hx    Colon polyps Neg Hx    Esophageal cancer Neg Hx    Rectal  cancer Neg Hx    Stomach cancer Neg Hx    Crohn's disease Neg Hx    Ulcerative colitis Neg Hx     Health Maintenance  Topic Date Due   Medicare Annual Wellness (AWV)  06/20/2022   COVID-19 Vaccine (4 - 2023-24 season) 08/04/2023   DTaP/Tdap/Td (3 - Td or Tdap) 10/18/2027   Colonoscopy  04/29/2028   Pneumonia Vaccine 63+ Years old  Completed   INFLUENZA VACCINE  Completed   Zoster Vaccines- Shingrix  Completed   HPV VACCINES  Aged Out   Hepatitis C Screening  Discontinued     ----------------------------------------------------------------------------------------------------------------------------------------------------------------------------------------------------------------- Physical Exam BP (!) 143/83   Pulse 74   Ht 5\' 10"  (1.778 m)   Wt (!) 322 lb (146.1 kg)   SpO2 99%   BMI 46.20 kg/m   Physical Exam Constitutional:      Appearance: Normal appearance.  HENT:     Head: Normocephalic and atraumatic.  Eyes:     General: No scleral icterus. Cardiovascular:     Rate and Rhythm: Normal rate and regular rhythm.  Pulmonary:     Effort: Pulmonary effort is normal.     Breath sounds: Normal breath sounds.  Musculoskeletal:     Cervical back: Neck supple.  Neurological:     Mental Status: He is alert.  Psychiatric:        Mood and Affect: Mood normal.        Behavior: Behavior normal.     ------------------------------------------------------------------------------------------------------------------------------------------------------------------------------------------------------------------- Assessment and Plan  Anxiety Remains well controlled with Paxil.  Continue at current strength.   Hyperlipidemia with target low density lipoprotein (LDL) cholesterol less than 130 mg/dL Doing well with simvastatin.   Elevated PSA Repeat psa today.   Prediabetes A1c has increased further.  Encouraged dietary changes.  Repeat  A1c in 3 months.   OSA  (obstructive sleep apnea) Recently started on CPAP, doing well.  He is getting good benefit from this and tolerating well.   Essential hypertension BP improved on second check.  Recommend home monitoring.  Continue current medication.     No orders of the defined types were placed in this encounter.   Return in about 4 months (around 01/18/2024) for Hypertension.    This visit occurred during the SARS-CoV-2 public health emergency.  Safety protocols were in place, including screening questions prior to the visit, additional usage of staff PPE, and extensive cleaning of exam room while observing appropriate contact  time as indicated for disinfecting solutions.

## 2023-09-17 NOTE — Assessment & Plan Note (Signed)
Repeat psa today

## 2023-09-17 NOTE — Telephone Encounter (Signed)
Patient advised.

## 2023-09-18 LAB — PSA, TOTAL AND FREE
PSA, Free Pct: 9.7 %
PSA, Free: 0.36 ng/mL
Prostate Specific Ag, Serum: 3.7 ng/mL (ref 0.0–4.0)

## 2023-10-08 DIAGNOSIS — G4733 Obstructive sleep apnea (adult) (pediatric): Secondary | ICD-10-CM | POA: Diagnosis not present

## 2023-10-10 DIAGNOSIS — G4733 Obstructive sleep apnea (adult) (pediatric): Secondary | ICD-10-CM | POA: Diagnosis not present

## 2023-11-07 DIAGNOSIS — G4733 Obstructive sleep apnea (adult) (pediatric): Secondary | ICD-10-CM | POA: Diagnosis not present

## 2023-11-09 DIAGNOSIS — G4733 Obstructive sleep apnea (adult) (pediatric): Secondary | ICD-10-CM | POA: Diagnosis not present

## 2023-11-20 DIAGNOSIS — L408 Other psoriasis: Secondary | ICD-10-CM | POA: Diagnosis not present

## 2023-11-20 DIAGNOSIS — Z79899 Other long term (current) drug therapy: Secondary | ICD-10-CM | POA: Diagnosis not present

## 2023-11-20 DIAGNOSIS — L280 Lichen simplex chronicus: Secondary | ICD-10-CM | POA: Diagnosis not present

## 2023-11-20 DIAGNOSIS — L400 Psoriasis vulgaris: Secondary | ICD-10-CM | POA: Diagnosis not present

## 2023-12-08 DIAGNOSIS — G4733 Obstructive sleep apnea (adult) (pediatric): Secondary | ICD-10-CM | POA: Diagnosis not present

## 2023-12-10 DIAGNOSIS — G4733 Obstructive sleep apnea (adult) (pediatric): Secondary | ICD-10-CM | POA: Diagnosis not present

## 2024-01-10 DIAGNOSIS — G4733 Obstructive sleep apnea (adult) (pediatric): Secondary | ICD-10-CM | POA: Diagnosis not present

## 2024-01-20 ENCOUNTER — Ambulatory Visit (INDEPENDENT_AMBULATORY_CARE_PROVIDER_SITE_OTHER): Payer: Medicare HMO | Admitting: Family Medicine

## 2024-01-20 ENCOUNTER — Encounter: Payer: Self-pay | Admitting: Family Medicine

## 2024-01-20 VITALS — BP 157/75 | HR 75 | Ht 70.0 in | Wt 327.0 lb

## 2024-01-20 DIAGNOSIS — I152 Hypertension secondary to endocrine disorders: Secondary | ICD-10-CM

## 2024-01-20 DIAGNOSIS — E1169 Type 2 diabetes mellitus with other specified complication: Secondary | ICD-10-CM | POA: Diagnosis not present

## 2024-01-20 DIAGNOSIS — F419 Anxiety disorder, unspecified: Secondary | ICD-10-CM | POA: Diagnosis not present

## 2024-01-20 DIAGNOSIS — E785 Hyperlipidemia, unspecified: Secondary | ICD-10-CM

## 2024-01-20 DIAGNOSIS — E1159 Type 2 diabetes mellitus with other circulatory complications: Secondary | ICD-10-CM

## 2024-01-20 DIAGNOSIS — R7303 Prediabetes: Secondary | ICD-10-CM

## 2024-01-20 DIAGNOSIS — I10 Essential (primary) hypertension: Secondary | ICD-10-CM | POA: Diagnosis not present

## 2024-01-20 LAB — POCT GLYCOSYLATED HEMOGLOBIN (HGB A1C): HbA1c, POC (controlled diabetic range): 6.8 % — AB (ref 0.0–7.0)

## 2024-01-20 MED ORDER — MOUNJARO 5 MG/0.5ML ~~LOC~~ SOAJ: 5.0000 mg | SUBCUTANEOUS | 1 refills | Status: DC

## 2024-01-20 MED ORDER — LISINOPRIL-HYDROCHLOROTHIAZIDE 20-12.5 MG PO TABS: 2.0000 | ORAL_TABLET | Freq: Every day | ORAL | 3 refills | Status: DC

## 2024-01-20 MED ORDER — MOUNJARO 2.5 MG/0.5ML ~~LOC~~ SOAJ: 2.5000 mg | SUBCUTANEOUS | 0 refills | Status: DC

## 2024-01-20 NOTE — Progress Notes (Signed)
CHRISTIAAN STREBECK - 68 y.o. male MRN [id number]  Date of birth: 25-Nov-1956  Subjective Chief Complaint  Patient presents with   Medical Management of Chronic Issues    HPI Nathaniel Montes is a 68 y.o. male here today for follow up visit.   Continues on amlodipine and lisinopril/hydrochlorothiazide for management of HTN.  BP is elevated. Marland Kitchen     He denies chest pain, shortness of breath, palpitations, headache or vision changes.   A1c elevated previously at 6.5%.  Today his A1c is elevated at 6.8%.   Tolerating simvastatin well at current strength for treatment of HLD.    Mood remains stable with paxil at current strength.   ROS:  A comprehensive ROS was completed and negative except as noted per HPI  No Known Allergies  Past Medical History:  Diagnosis Date   Anxiety 11/17/2012   Arthritis    LEFT WRIST   HYPERLIPIDEMIA 11/29/2009   HYPERTENSION 11/29/2009   Psoriasis 11/17/2012    Past Surgical History:  Procedure Laterality Date   APPENDECTOMY     COLONOSCOPY  08/16/2010   2008   LEG SURGERY Left    broken bone left lower leg, repaired   meniscus left knee Left    repair   POLYPECTOMY     REPLACEMENT TOTAL KNEE Right 12/2018   SHOULDER SURGERY Right    torn bicep, rotator cuff, ligament work injury    Social History   Socioeconomic History   Marital status: Married    Spouse name: Tracie   Number of children: 2   Years of education: 12   Highest education level: 12th grade  Occupational History    Comment: Retired  Tobacco Use   Smoking status: Former    Current packs/day: 0.00    Types: Cigarettes    Quit date: 1996    Years since quitting: 29.1   Smokeless tobacco: Never  Vaping Use   Vaping status: Never Used  Substance and Sexual Activity   Alcohol use: Yes    Comment: 2 case of beer per week   Drug use: No   Sexual activity: Not on file  Other Topics Concern   Not on file  Social History Narrative   Lives with wife and his youngest son.  He hasn't been able to do much exercise lately. He is starting to go camping soon.   Social Drivers of Corporate investment banker Strain: Low Risk  (06/20/2021)   Overall Financial Resource Strain (CARDIA)    Difficulty of Paying Living Expenses: Not hard at all  Food Insecurity: No Food Insecurity (06/20/2021)   Hunger Vital Sign    Worried About Running Out of Food in the Last Year: Never true    Ran Out of Food in the Last Year: Never true  Transportation Needs: No Transportation Needs (06/20/2021)   PRAPARE - Administrator, Civil Service (Medical): No    Lack of Transportation (Non-Medical): No  Physical Activity: Inactive (06/20/2021)   Exercise Vital Sign    Days of Exercise per Week: 0 days    Minutes of Exercise per Session: 0 min  Stress: No Stress Concern Present (06/20/2021)   [employer] of Occupational Health - Occupational Stress Questionnaire    Feeling of Stress : Not at all  Social Connections: Moderately Isolated (06/20/2021)   Social Connection and Isolation Panel [NHANES]    Frequency of Communication with Friends and Family: More than three times a week    Frequency  of Social Gatherings with Friends and Family: Twice a week    Attends Religious Services: Never    Database administrator or Organizations: No    Attends Engineer, structural: Never    Marital Status: Married    Family History  Problem Relation Age of Onset   Bladder Cancer Mother    Hypertension Father    Healthy Sister    Healthy Sister    Healthy Brother    Colon cancer Neg Hx    Colon polyps Neg Hx    Esophageal cancer Neg Hx    Rectal cancer Neg Hx    Stomach cancer Neg Hx    Crohn's disease Neg Hx    Ulcerative colitis Neg Hx     Health Maintenance  Topic Date Due   FOOT EXAM  Never done   OPHTHALMOLOGY EXAM  Never done   Diabetic kidney evaluation - Urine ACR  Never done   Medicare Annual Wellness (AWV)  06/20/2022   HEMOGLOBIN A1C  01/16/2024    COVID-19 Vaccine (4 - 2024-25 season) 02/04/2025 (Originally 08/04/2023)   Diabetic kidney evaluation - eGFR measurement  07/15/2024   DTaP/Tdap/Td (3 - Td or Tdap) 10/18/2027   Colonoscopy  04/29/2028   Pneumonia Vaccine 34+ Years old  Completed   INFLUENZA VACCINE  Completed   Zoster Vaccines- Shingrix  Completed   HPV VACCINES  Aged Out   Hepatitis C Screening  Discontinued     ----------------------------------------------------------------------------------------------------------------------------------------------------------------------------------------------------------------- Physical Exam BP (!) 163/76 (BP Location: Left Arm, Patient Position: Sitting, Cuff Size: Large)   Pulse 82   Ht 5\' 10"  (1.778 m)   Wt (!) 327 lb (148.3 kg)   SpO2 96%   BMI 46.92 kg/m   Physical Exam Constitutional:      Appearance: Normal appearance.  HENT:     Head: Normocephalic and atraumatic.  Eyes:     General: No scleral icterus. Cardiovascular:     Rate and Rhythm: Normal rate and regular rhythm.  Pulmonary:     Effort: Pulmonary effort is normal.     Breath sounds: Normal breath sounds.  Neurological:     Mental Status: He is alert.  Psychiatric:        Mood and Affect: Mood normal.        Behavior: Behavior normal.     ------------------------------------------------------------------------------------------------------------------------------------------------------------------------------------------------------------------- Assessment and Plan  Type 2 diabetes mellitus with other specified complication (HCC) A1c has increased further.  Recommend adding Mounjaro.  Dietary changes encouraged.  F/u in 4 months.   Hyperlipidemia with target low density lipoprotein (LDL) cholesterol less than 130 mg/dL Doing well with simvastatin. Recommend continuation.   Anxiety Remains well controlled with Paxil.  Continue at current strength.   Hypertension associated with diabetes  (HCC) BP elevated.  Change lisinopril/hydrochlorothiazide to 20/12.5, 2 tabs daily. Low sodium diet encouraged.    Meds ordered this encounter  Medications   tirzepatide (MOUNJARO) 2.5 MG/0.5ML Pen    Sig: Inject 2.5 mg into the skin once a week.    Dispense:  2 mL    Refill:  0   tirzepatide (MOUNJARO) 5 MG/0.5ML Pen    Sig: Inject 5 mg into the skin once a week.    Dispense:  6 mL    Refill:  1   lisinopril-hydrochlorothiazide (ZESTORETIC) 20-12.5 MG tablet    Sig: Take 2 tablets by mouth daily.    Dispense:  180 tablet    Refill:  3    No follow-ups on file.  This visit occurred during the SARS-CoV-2 public health emergency.  Safety protocols were in place, including screening questions prior to the visit, additional usage of staff PPE, and extensive cleaning of exam room while observing appropriate contact time as indicated for disinfecting solutions.

## 2024-01-20 NOTE — Assessment & Plan Note (Signed)
BP elevated.  Change lisinopril/hydrochlorothiazide to 20/12.5, 2 tabs daily. Low sodium diet encouraged.

## 2024-01-20 NOTE — Assessment & Plan Note (Signed)
Doing well with simvastatin.  Recommend continuation. ?

## 2024-01-20 NOTE — Assessment & Plan Note (Signed)
A1c has increased further.  Recommend adding Mounjaro.  Dietary changes encouraged.  F/u in 4 months.

## 2024-01-20 NOTE — Assessment & Plan Note (Signed)
Remains well controlled with Paxil.  Continue at current strength.

## 2024-01-20 NOTE — Patient Instructions (Signed)

## 2024-02-02 ENCOUNTER — Other Ambulatory Visit: Payer: Self-pay | Admitting: Family Medicine

## 2024-02-02 DIAGNOSIS — M25532 Pain in left wrist: Secondary | ICD-10-CM

## 2024-02-03 ENCOUNTER — Ambulatory Visit: Payer: Medicare HMO

## 2024-02-05 ENCOUNTER — Telehealth: Payer: Self-pay

## 2024-02-05 ENCOUNTER — Ambulatory Visit (INDEPENDENT_AMBULATORY_CARE_PROVIDER_SITE_OTHER)

## 2024-02-05 VITALS — BP 147/69 | HR 69 | Ht 70.0 in

## 2024-02-05 DIAGNOSIS — I10 Essential (primary) hypertension: Secondary | ICD-10-CM

## 2024-02-05 NOTE — Patient Instructions (Signed)
 continue current medication regimen, reduce sodium in diet and return in 3 weeks for next BP check.

## 2024-02-05 NOTE — Telephone Encounter (Signed)
 Good Morning Cheryl ,  Dr. Ashley Royalty was hoping you could check to see if patient would qualify for patient assistance for the mounjaro 2.5mg  ? Patient states cost after PA was still $800 for him out of pocket.  Thank you!!

## 2024-02-05 NOTE — Progress Notes (Signed)
   Established Patient Office Visit  Subjective   Patient ID: Nathaniel MOLZAHN, male    DOB: 02-14-1956  Age: 68 y.o. MRN: [id number]  Chief Complaint  Patient presents with   Hypertension    BP check nurse visit.     HPI  Hypertension- BP check nurse visit. Patient denies chest pain, shortness of breath, palpitations, headache, or medication problems.   ROS    Objective:     BP (!) 147/69   Pulse 69   Ht 5\' 10"  (1.778 m)   SpO2 97%   BMI 46.92 kg/m    Physical Exam   No results found for any visits on 02/05/24.    The ASCVD Risk score (Arnett DK, et al., 2019) failed to calculate for the following reasons:   The valid total cholesterol range is 130 to 320 mg/dL    Assessment & Plan:  BP check -initial reading = 152/61.second reading = 147/69. Patient instructed to continue current medication regimen, reduce sodium in diet and return in 3 weeks for next BP check.  Problem List Items Addressed This Visit       Cardiovascular and Mediastinum   Essential hypertension - Primary    Return in about 3 weeks (around 02/26/2024) for BP check as nurse visit. Elizabeth Palau, LPN

## 2024-02-07 DIAGNOSIS — G4733 Obstructive sleep apnea (adult) (pediatric): Secondary | ICD-10-CM | POA: Diagnosis not present

## 2024-02-07 NOTE — Telephone Encounter (Signed)
 Patient is willing to change to Ozempic and have it ran through pt assistance program.

## 2024-02-10 ENCOUNTER — Telehealth: Payer: Self-pay

## 2024-02-10 NOTE — Progress Notes (Signed)
 02/10/2024 Name: Nathaniel Montes MRN: [id number] DOB: Feb 07, 1956  Chief Complaint  Patient presents with   Diabetes Management Plan   [name] is a 68 y.o. year old male who presented for a telephone visit.   They were referred to the pharmacist by their PCP for assistance in managing medication access.   Subjective:  Care Team: Primary Care Provider: Everrett Coombe, DO ; Next Scheduled Visit: 05/18/24  Medication Access/Adherence  Current Pharmacy:  [facility] [phone] - [place], [place] - [address] [place] [address] Phone: [phone] Fax: [phone]  -Patient reports affordability concerns with their medications: Yes  -Patient reports access/transportation concerns to their pharmacy: No  -Patient reports adherence concerns with their medications:  Yes    Diabetes: Current medications: none -Patient recently prescribed Mounjaro, but he has been unable to start the medication due to cost -Patient and provider are open to Ozempic if eligible for Novo patient assistance program -Most recent A1c 6.8% on 01/20/2024 -No home blood glucose readings reported -Patient does not endorse any signs or symptoms of hypoglycemia or hyperglycemia  Objective:  Lab Results  Component Value Date   HGBA1C 6.8 (A) 01/20/2024   Lab Results  Component Value Date   CREATININE 1.15 07/16/2023   BUN 12 07/16/2023   NA 132 (L) 07/16/2023   K 4.8 07/16/2023   CL 96 07/16/2023   CO2 23 07/16/2023   Medications Reviewed Today     Reviewed by Lenna Gilford, RPH (Pharmacist) on 02/10/24 at [phone]  Med List Status: <None>   Medication Order Taking? Sig Documenting Provider Last Dose Status Informant  AMBULATORY NON FORMULARY MEDICATION [id number]  Please provide home Bipap machine with pressure settings of 25/ 21 cm H2O  Please provide: Large size [employer] Full Face Evora Full mask, tubing, filters and heated humidification. Everrett Coombe, DO  Active   amLODipine (NORVASC) 5 MG tablet [id number]  Take 1 tablet (5 mg total) by mouth daily. Everrett Coombe, DO  Active   BABY ASPIRIN PO [id number]  Take 81 mg by mouth daily. [provider]  Active Self  lisinopril-hydrochlorothiazide (ZESTORETIC) 20-12.5 MG tablet [id number]  Take 2 tablets by mouth daily. Everrett Coombe, DO  Active   meloxicam (MOBIC) 15 MG tablet [id number]  TAKE 1 TABLET (15 MG TOTAL) BY MOUTH DAILY. Everrett Coombe, DO  Active   PARoxetine (PAXIL) 20 MG tablet [id number]  Take 1 tablet (20 mg total) by mouth daily. Everrett Coombe, DO  Active   Risankizumab-rzaa [facility]) 150 MG/ML Konrad Penta [id number]  Inject into the skin. ONCE EVERY  3 MONTHS [provider]  Active   rOPINIRole (REQUIP) 0.5 MG tablet [id number]  TAKE 1/2 TO 1 TAB AT BEDTIME AS NEEDED FOR RESTLESS LEG Everrett Coombe, DO  Active   Semaglutide,0.25 or 0.5MG /DOS, (OZEMPIC, 0.25 OR 0.5 MG/DOSE,) 2 MG/3ML SOPN [id number] Yes Inject into the skin. Inject 0.25mg  weekly for 4 weeks, then increase to 0.5mg  weekly [provider]  Active            Med Note [name], [name]   Mon Feb 10, 2024  9:41 AM) Working on PAP  simvastatin (ZOCOR) 80 MG tablet [id number]  Take 1 tablet (80 mg total) by mouth daily. Everrett Coombe, DO  Active            Assessment/Plan:   Diabetes: -Currently controlled with A1c <7% -No documented personal or family history of multiple endocrine neoplasia type 2, medullary thyroid cancer;  personal history of pancreatitis or gallbladder disease. -Meets financial criteria for Ozempic patient assistance program through [employer]. Will collaborate with provider, CPhT, and patient to pursue assistance.   Follow Up Plan: Will monitor progress of patient assistance application and schedule follow up to see how patient tolerates medication approximately 4 weeks after medication initiation  Lenna Gilford, PharmD, DPLA

## 2024-02-12 ENCOUNTER — Telehealth: Payer: Self-pay

## 2024-02-12 NOTE — Telephone Encounter (Signed)
 PAP: Patient assistance application for Ozempic through [employer] has been mailed to pt's home address on file. Provider portion of application will be faxed to provider's office.

## 2024-02-13 DIAGNOSIS — N529 Male erectile dysfunction, unspecified: Secondary | ICD-10-CM | POA: Diagnosis not present

## 2024-02-13 DIAGNOSIS — E785 Hyperlipidemia, unspecified: Secondary | ICD-10-CM | POA: Diagnosis not present

## 2024-02-13 DIAGNOSIS — E1142 Type 2 diabetes mellitus with diabetic polyneuropathy: Secondary | ICD-10-CM | POA: Diagnosis not present

## 2024-02-13 DIAGNOSIS — Z8249 Family history of ischemic heart disease and other diseases of the circulatory system: Secondary | ICD-10-CM | POA: Diagnosis not present

## 2024-02-13 DIAGNOSIS — Z809 Family history of malignant neoplasm, unspecified: Secondary | ICD-10-CM | POA: Diagnosis not present

## 2024-02-13 DIAGNOSIS — G4733 Obstructive sleep apnea (adult) (pediatric): Secondary | ICD-10-CM | POA: Diagnosis not present

## 2024-02-13 DIAGNOSIS — Z7982 Long term (current) use of aspirin: Secondary | ICD-10-CM | POA: Diagnosis not present

## 2024-02-13 DIAGNOSIS — L4050 Arthropathic psoriasis, unspecified: Secondary | ICD-10-CM | POA: Diagnosis not present

## 2024-02-13 DIAGNOSIS — F419 Anxiety disorder, unspecified: Secondary | ICD-10-CM | POA: Diagnosis not present

## 2024-02-13 DIAGNOSIS — I10 Essential (primary) hypertension: Secondary | ICD-10-CM | POA: Diagnosis not present

## 2024-02-13 DIAGNOSIS — F325 Major depressive disorder, single episode, in full remission: Secondary | ICD-10-CM | POA: Diagnosis not present

## 2024-02-20 NOTE — Telephone Encounter (Signed)
 Received Provider pages of APP- waiting on Patient to return his part of application for Ozempic.

## 2024-02-24 ENCOUNTER — Telehealth: Payer: Self-pay

## 2024-02-24 NOTE — Telephone Encounter (Signed)
 PAP: Application for Ozempic has been submitted to [employer], via fax

## 2024-02-26 ENCOUNTER — Ambulatory Visit (INDEPENDENT_AMBULATORY_CARE_PROVIDER_SITE_OTHER): Admitting: Family Medicine

## 2024-02-26 ENCOUNTER — Telehealth: Payer: Self-pay | Admitting: Family Medicine

## 2024-02-26 ENCOUNTER — Other Ambulatory Visit: Payer: Self-pay | Admitting: Family Medicine

## 2024-02-26 ENCOUNTER — Encounter: Payer: Self-pay | Admitting: Family Medicine

## 2024-02-26 VITALS — BP 144/74 | HR 89 | Ht 70.0 in | Wt 323.0 lb

## 2024-02-26 DIAGNOSIS — I10 Essential (primary) hypertension: Secondary | ICD-10-CM

## 2024-02-26 MED ORDER — AMLODIPINE BESYLATE 10 MG PO TABS: 10.0000 mg | ORAL_TABLET | Freq: Every day | ORAL | 3 refills | Status: DC

## 2024-02-26 NOTE — Telephone Encounter (Signed)
 [employer]o [employer] called in stated that the application for this patient was incomplete. States that the Provider portion is not complete.  Please advise

## 2024-02-26 NOTE — Progress Notes (Signed)
 Pt here to have BP rechecked. Elevated last OV at 157/75. Denies headache or medication changes.                            Pt BP still slightly elevated at 144/74. Per  Dr. Ashley Royalty pt is to increase his amlodipine 5 mg to 10 mg and RTC in 1 month for nurse visit BP.

## 2024-02-27 NOTE — Progress Notes (Signed)
   02/27/2024  Patient ID: Nathaniel Montes, male   DOB: [birth year], 68 y.o.   MRN: [id number]  Contacted Novo PAP, and patient has been approved to receive Ozempic 025/0.5mg  through 12/02/2024.  Medication should arrive at [facility] in 10-14 business days (April 16th at the latest).  Sending patient a MyChart message to make him aware.  I will also provide the number for Novo in case medication does not arrive by then, so he can get a 30 day voucher to cover 1 month at his local pharmacy.  Lenna Gilford, PharmD, DPLA

## 2024-02-28 ENCOUNTER — Other Ambulatory Visit: Payer: Self-pay | Admitting: Family Medicine

## 2024-02-28 DIAGNOSIS — F419 Anxiety disorder, unspecified: Secondary | ICD-10-CM

## 2024-03-02 NOTE — Telephone Encounter (Signed)
 PAP: Patient assistance application for Ozempic has been approved by PAP Companies: [employer] from 02/27/2024 to 12/02/2024. Medication should be delivered to PAP Delivery: Provider's office. For further shipping updates, please [employer] at [phone]. Patient ID is: [id number]

## 2024-03-02 NOTE — Progress Notes (Signed)
 Pharmacy Medication Assistance Program Note    03/02/2024  Patient ID: Nathaniel Montes, male   DOB: 1956-10-05, 68 y.o.   MRN: [id number]     03/02/2024  Outreach Medication One  Initial Outreach Date (Medication One) 02/12/2024  Manufacturer Medication One [employer] Drugs Ozempic  Type of Assistance Manufacturer Assistance  Date Application Sent to Patient 02/12/2024  Application Items Requested Application  Date Application Sent to Prescriber 02/12/2024  Name of Prescriber Everrett Coombe  Date Application Received From Patient 02/24/2024  Application Items Received From Patient Application;Other  Date Application Received From Provider 02/20/2024  Date Application Submitted to Manufacturer 02/24/2024  Method Application Sent to Manufacturer Fax  Patient Assistance Determination Approved  Approval Start Date 02/27/2024  Approval End Date 12/02/2024  Patient Notification Method Telephone Call  Telephone Call Outcome Successful

## 2024-03-09 DIAGNOSIS — G4733 Obstructive sleep apnea (adult) (pediatric): Secondary | ICD-10-CM | POA: Diagnosis not present

## 2024-03-12 ENCOUNTER — Telehealth: Payer: Self-pay

## 2024-03-12 NOTE — Telephone Encounter (Signed)
 PAP assistance Ozempic has arrived. Called and advised pt.  Location: Main fridge door  Pt's wife will pick-up.

## 2024-03-13 DIAGNOSIS — E119 Type 2 diabetes mellitus without complications: Secondary | ICD-10-CM | POA: Diagnosis not present

## 2024-03-13 DIAGNOSIS — H524 Presbyopia: Secondary | ICD-10-CM | POA: Diagnosis not present

## 2024-03-13 LAB — HM DIABETES EYE EXAM

## 2024-03-30 ENCOUNTER — Ambulatory Visit (INDEPENDENT_AMBULATORY_CARE_PROVIDER_SITE_OTHER)

## 2024-03-30 VITALS — BP 136/64 | HR 85

## 2024-03-30 DIAGNOSIS — I10 Essential (primary) hypertension: Secondary | ICD-10-CM

## 2024-03-30 NOTE — Progress Notes (Signed)
   Established Patient Office Visit  Subjective   Patient ID: Nathaniel Montes, male    DOB: Mar 20, 1956  Age: 68 y.o. MRN: [id number]  Chief Complaint  Patient presents with   Hypertension    HPI  DARRIO SPHAR is here for blood pressure check. Denies chest pain, shortness of breath or dizziness.   ROS    Objective:     BP 136/64   Pulse 85   SpO2 95%    Physical Exam   No results found for any visits on 03/30/24.    The ASCVD Risk score (Arnett DK, et al., 2019) failed to calculate for the following reasons:   The valid total cholesterol range is 130 to 320 mg/dL    Assessment & Plan:  Blood pressure check - Patient advised to continue medication as directed. Follow up in June with Dr Augustus Ledger as scheduled.   Problem List Items Addressed This Visit       Unprioritized   Essential hypertension - Primary    Return in about 7 weeks (around 05/18/2024) for HTN with Dr Augustus Ledger. Arleen Bells, Aneta Bar, CMA

## 2024-04-08 DIAGNOSIS — G4733 Obstructive sleep apnea (adult) (pediatric): Secondary | ICD-10-CM | POA: Diagnosis not present

## 2024-04-18 DIAGNOSIS — G4733 Obstructive sleep apnea (adult) (pediatric): Secondary | ICD-10-CM | POA: Diagnosis not present

## 2024-05-18 ENCOUNTER — Ambulatory Visit: Payer: Medicare HMO | Admitting: Family Medicine

## 2024-05-26 ENCOUNTER — Encounter: Payer: Self-pay | Admitting: Family Medicine

## 2024-05-26 ENCOUNTER — Ambulatory Visit (INDEPENDENT_AMBULATORY_CARE_PROVIDER_SITE_OTHER): Admitting: Family Medicine

## 2024-05-26 VITALS — BP 144/68 | HR 80 | Ht 70.0 in | Wt 315.0 lb

## 2024-05-26 DIAGNOSIS — E785 Hyperlipidemia, unspecified: Secondary | ICD-10-CM | POA: Diagnosis not present

## 2024-05-26 DIAGNOSIS — E1169 Type 2 diabetes mellitus with other specified complication: Secondary | ICD-10-CM

## 2024-05-26 DIAGNOSIS — I152 Hypertension secondary to endocrine disorders: Secondary | ICD-10-CM | POA: Diagnosis not present

## 2024-05-26 DIAGNOSIS — G2581 Restless legs syndrome: Secondary | ICD-10-CM | POA: Diagnosis not present

## 2024-05-26 DIAGNOSIS — E1159 Type 2 diabetes mellitus with other circulatory complications: Secondary | ICD-10-CM | POA: Diagnosis not present

## 2024-05-26 LAB — POCT GLYCOSYLATED HEMOGLOBIN (HGB A1C): HbA1c, POC (controlled diabetic range): 5.9 % (ref 0.0–7.0)

## 2024-05-26 LAB — POCT UA - MICROALBUMIN
Albumin/Creatinine Ratio, Urine, POC: <30
Creatinine, POC: 100 mg/dL
Microalbumin Ur, POC: 30 mg/L

## 2024-05-26 MED ORDER — OZEMPIC (0.25 OR 0.5 MG/DOSE) 2 MG/3ML ~~LOC~~ SOPN: PEN_INJECTOR | SUBCUTANEOUS | 5 refills | Status: AC

## 2024-05-26 MED ORDER — ROPINIROLE HCL 1 MG PO TABS: 1.0000 mg | ORAL_TABLET | Freq: Every day | ORAL | 1 refills | Status: DC

## 2024-05-26 NOTE — Assessment & Plan Note (Signed)
 BP better than it has been but still a little elevated in office.  Looked good at last nurse visit.  Low sodium diet and continued weight loss encouraged.

## 2024-05-26 NOTE — Addendum Note (Signed)
 Addended by: Alysen Smylie Z on: 05/26/2024 11:04 AM   Modules accepted: Orders

## 2024-05-26 NOTE — Progress Notes (Signed)
 DANG MATHISON - 68 y.o. male MRN [id number]  Date of birth: 06-Jul-1956  Subjective Chief Complaint  Patient presents with   Diabetes   Hypertension    HPI JAIRE PINKHAM is a 68 y.o. male here today for follow up visit.   Reports that he is doing well. SABRA   He was started on Ozempic at previous visit for treatment of T2DM.  Doing well with this so far.  A1c improved to 5.9% today.  He is working on dietary changes.    BP is much better.  Currently on lisinopril /hydrochlorothiazide  and amlodipine  at max strength.  He denies chest pain, shortness of breath, palpitations, headache or vision changes.   ROS:  A comprehensive ROS was completed and negative except as noted per HPI  No Known Allergies  Past Medical History:  Diagnosis Date   Anxiety 11/17/2012   Arthritis    LEFT WRIST   HYPERLIPIDEMIA 11/29/2009   HYPERTENSION 11/29/2009   Psoriasis 11/17/2012    Past Surgical History:  Procedure Laterality Date   APPENDECTOMY     COLONOSCOPY  08/16/2010   2008   LEG SURGERY Left    broken bone left lower leg, repaired   meniscus left knee Left    repair   POLYPECTOMY     REPLACEMENT TOTAL KNEE Right 12/2018   SHOULDER SURGERY Right    torn bicep, rotator cuff, ligament work injury    Social History   Socioeconomic History   Marital status: Married    Spouse name: Tracie   Number of children: 2   Years of education: 12   Highest education level: 12th grade  Occupational History    Comment: Retired  Tobacco Use   Smoking status: Former    Current packs/day: 0.00    Types: Cigarettes    Quit date: 1996    Years since quitting: 29.4   Smokeless tobacco: Never  Vaping Use   Vaping status: Never Used  Substance and Sexual Activity   Alcohol use: Yes    Comment: 2 case of beer per week   Drug use: No   Sexual activity: Not on file  Other Topics Concern   Not on file  Social History Narrative   Lives with wife and his youngest son. He hasn't been able to  do much exercise lately. He is starting to go camping soon.   Social Drivers of Corporate investment banker Strain: Low Risk  (06/20/2021)   Overall Financial Resource Strain (CARDIA)    Difficulty of Paying Living Expenses: Not hard at all  Food Insecurity: No Food Insecurity (05/26/2024)   Hunger Vital Sign    Worried About Running Out of Food in the Last Year: Never true    Ran Out of Food in the Last Year: Never true  Transportation Needs: No Transportation Needs (06/20/2021)   PRAPARE - Administrator, Civil Service (Medical): No    Lack of Transportation (Non-Medical): No  Physical Activity: Inactive (05/26/2024)   Exercise Vital Sign    Days of Exercise per Week: 0 days    Minutes of Exercise per Session: 0 min  Stress: No Stress Concern Present (06/20/2021)   [employer] of Occupational Health - Occupational Stress Questionnaire    Feeling of Stress : Not at all  Social Connections: Moderately Isolated (05/26/2024)   Social Connection and Isolation Panel    Frequency of Communication with Friends and Family: Three times a week    Frequency of Social  Gatherings with Friends and Family: Twice a week    Attends Religious Services: Never    Database administrator or Organizations: No    Attends Engineer, structural: Never    Marital Status: Married    Family History  Problem Relation Age of Onset   Bladder Cancer Mother    Hypertension Father    Healthy Sister    Healthy Sister    Healthy Brother    Colon cancer Neg Hx    Colon polyps Neg Hx    Esophageal cancer Neg Hx    Rectal cancer Neg Hx    Stomach cancer Neg Hx    Crohn's disease Neg Hx    Ulcerative colitis Neg Hx     Health Maintenance  Topic Date Due   Diabetic kidney evaluation - Urine ACR  Never done   Medicare Annual Wellness (AWV)  06/20/2022   COVID-19 Vaccine (4 - 2024-25 season) 02/04/2025 (Originally 08/04/2023)   INFLUENZA VACCINE  07/03/2024   Diabetic kidney evaluation -  eGFR measurement  07/15/2024   HEMOGLOBIN A1C  11/25/2024   OPHTHALMOLOGY EXAM  03/13/2025   FOOT EXAM  05/26/2025   DTaP/Tdap/Td (3 - Td or Tdap) 10/18/2027   Colonoscopy  04/29/2028   Pneumococcal Vaccine: 50+ Years  Completed   Zoster Vaccines- Shingrix  Completed   Hepatitis B Vaccines  Aged Out   HPV VACCINES  Aged Out   Meningococcal B Vaccine  Aged Out   Hepatitis C Screening  Discontinued     ----------------------------------------------------------------------------------------------------------------------------------------------------------------------------------------------------------------- Physical Exam BP (!) 144/68 (BP Location: Left Arm, Patient Position: Sitting, Cuff Size: Large)   Pulse 80   Ht 5' 10 (1.778 m)   Wt (!) 315 lb (142.9 kg)   SpO2 95%   BMI 45.20 kg/m   Physical Exam Constitutional:      Appearance: Normal appearance.   Eyes:     General: No scleral icterus.   Cardiovascular:     Rate and Rhythm: Normal rate and regular rhythm.   Neurological:     General: No focal deficit present.     Mental Status: He is alert.   Psychiatric:        Mood and Affect: Mood normal.        Behavior: Behavior normal.     ------------------------------------------------------------------------------------------------------------------------------------------------------------------------------------------------------------------- Assessment and Plan  Hypertension associated with diabetes (HCC) BP better than it has been but still a little elevated in office.  Looked good at last nurse visit.  Low sodium diet and continued weight loss encouraged.   Type 2 diabetes mellitus with other specified complication (HCC) Doing well with Ozempic at current strength.  Will plan to continued.   Hyperlipidemia with target low density lipoprotein (LDL) cholesterol less than 130 mg/dL Doing well with simvastatin . Recommend continuation.   RLS (restless legs  syndrome) Still with some RLS symptoms.  Increasing requip  to 1mg  at bedtime.    Meds ordered this encounter  Medications   Semaglutide,0.25 or 0.5MG /DOS, (OZEMPIC, 0.25 OR 0.5 MG/DOSE,) 2 MG/3ML SOPN    Sig: Inject 0.5mg  once weekly    Dispense:  3 mL    Refill:  5   rOPINIRole  (REQUIP ) 1 MG tablet    Sig: Take 1 tablet (1 mg total) by mouth at bedtime. TAKE 1/2 TO 1 TAB AT BEDTIME AS NEEDED FOR RESTLESS LEG    Dispense:  90 tablet    Refill:  1    Return in about 4 months (around 09/25/2024) for Type 2 Diabetes,  Hypertension.

## 2024-05-26 NOTE — Assessment & Plan Note (Signed)
 Doing well with Ozempic at current strength.  Will plan to continued.

## 2024-05-26 NOTE — Assessment & Plan Note (Signed)
 Still with some RLS symptoms.  Increasing requip  to 1mg  at bedtime.

## 2024-05-26 NOTE — Assessment & Plan Note (Signed)
Doing well with simvastatin.  Recommend continuation. ?

## 2024-05-28 ENCOUNTER — Ambulatory Visit (INDEPENDENT_AMBULATORY_CARE_PROVIDER_SITE_OTHER): Admitting: Sports Medicine

## 2024-05-28 ENCOUNTER — Ambulatory Visit

## 2024-05-28 DIAGNOSIS — M1712 Unilateral primary osteoarthritis, left knee: Secondary | ICD-10-CM | POA: Diagnosis not present

## 2024-05-28 DIAGNOSIS — M25461 Effusion, right knee: Secondary | ICD-10-CM | POA: Diagnosis not present

## 2024-05-28 DIAGNOSIS — M25462 Effusion, left knee: Secondary | ICD-10-CM | POA: Diagnosis not present

## 2024-05-28 DIAGNOSIS — M1711 Unilateral primary osteoarthritis, right knee: Secondary | ICD-10-CM

## 2024-05-28 DIAGNOSIS — Z96651 Presence of right artificial knee joint: Secondary | ICD-10-CM | POA: Diagnosis not present

## 2024-05-28 MED ORDER — ACETAMINOPHEN ER 650 MG PO TBCR: 650.0000 mg | EXTENDED_RELEASE_TABLET | Freq: Three times a day (TID) | ORAL | Status: AC | PRN

## 2024-05-28 NOTE — Progress Notes (Signed)
    Procedures performed today:    None.  Independent interpretation of notes and tests performed by another provider:   None.  Brief History, Exam, Impression, and Recommendations:    Primary osteoarthritis of left knee status post right knee arthroplasty This is a very pleasant 68 year old male, we last saw him in 2020, he had right knee osteoarthritis, ultimately he did need an arthroplasty. Right knee is doing well however he does have some discomfort on the lateral aspect of the tibial component. Left knee is swollen, moderate tenderness. He has tried compression sleeves without improvement. He takes meloxicam  daily for his wrist, it does not seem to help his knee sufficiently, adding arthritis Tylenol, home physical therapy, bilateral x-rays. In 4 weeks we will see him back and if insufficient improvement we will do a steroid injection.  Of note Guy is doing a great job with weight loss on Ozempic.    ____________________________________________ Debby PARAS. Curtis, M.D., ABFM., CAQSM., AME. Primary Care and Sports Medicine  [facility]  Adjunct Professor of [facility] Medicine  [employer]  Restaurant manager, fast food

## 2024-05-28 NOTE — Assessment & Plan Note (Addendum)
 This is a very pleasant 68 year old male, we last saw him in 2020, he had right knee osteoarthritis, ultimately he did need an arthroplasty. Right knee is doing well however he does have some discomfort on the lateral aspect of the tibial component. Left knee is swollen, moderate tenderness. He has tried compression sleeves without improvement. He takes meloxicam  daily for his wrist, it does not seem to help his knee sufficiently, adding arthritis Tylenol, home physical therapy, bilateral x-rays. In 4 weeks we will see him back and if insufficient improvement we will do a steroid injection.  Of note Nathaniel Montes is doing a great job with weight loss on Ozempic.

## 2024-07-01 ENCOUNTER — Telehealth: Payer: Self-pay

## 2024-07-01 NOTE — Telephone Encounter (Signed)
 Patient's PAP Ozempic  0.25/0.5 has arrived and is located in fridge.   Lot: [id number] Exp: 11/01/26 Quanitity: 4

## 2024-07-05 ENCOUNTER — Other Ambulatory Visit: Payer: Self-pay | Admitting: Family Medicine

## 2024-07-05 DIAGNOSIS — I10 Essential (primary) hypertension: Secondary | ICD-10-CM

## 2024-07-07 NOTE — Procedures (Signed)
 Nathaniel Montes

## 2024-07-09 ENCOUNTER — Other Ambulatory Visit: Payer: Self-pay | Admitting: Family Medicine

## 2024-07-09 ENCOUNTER — Other Ambulatory Visit (INDEPENDENT_AMBULATORY_CARE_PROVIDER_SITE_OTHER)

## 2024-07-09 ENCOUNTER — Ambulatory Visit (INDEPENDENT_AMBULATORY_CARE_PROVIDER_SITE_OTHER): Admitting: Sports Medicine

## 2024-07-09 ENCOUNTER — Encounter: Payer: Self-pay | Admitting: Sports Medicine

## 2024-07-09 DIAGNOSIS — M1712 Unilateral primary osteoarthritis, left knee: Secondary | ICD-10-CM

## 2024-07-09 MED ORDER — TRIAMCINOLONE ACETONIDE 40 MG/ML IJ SUSP
40.0000 mg | Freq: Once | INTRAMUSCULAR | Status: AC
  Administered 2024-07-09: 40 mg via INTRA_ARTICULAR

## 2024-07-09 NOTE — Progress Notes (Signed)
    Procedures performed today:    Procedure: Real-time Ultrasound Guided injection of the left knee Device: Samsung HS60  Verbal informed consent obtained.  Time-out conducted.  Noted no overlying erythema, induration, or other signs of local infection.  Skin prepped in a sterile fashion.  Local anesthesia: Topical Ethyl chloride.  With sterile technique and under real time ultrasound guidance: Effusion noted, 1 cc Kenalog  40, 2 cc lidocaine, 2 cc bupivacaine injected easily Completed without difficulty  Advised to call if fevers/chills, erythema, induration, drainage, or persistent bleeding.  Images permanently stored and available for review in PACS.  Impression: Technically successful ultrasound guided injection.  Independent interpretation of notes and tests performed by another provider:   None.  Brief History, Exam, Impression, and Recommendations:    Primary osteoarthritis of left knee status post right knee arthroplasty This very pleasant 68 year old male with known knee osteoarthritis returns, x-rays confirmed arthritis per He has not improved with arthritis from Tylenol , meloxicam  taking daily for his wrist, compression and home physical therapy, today we did left knee injection. Return to see me 6 weeks. If insufficient improvement we will switch to viscosupplementation.    ____________________________________________ Debby PARAS. Curtis, M.D., ABFM., CAQSM., AME. Primary Care and Sports Medicine [place] [facility]  Adjunct Professor of [facility] Medicine  [employer]  Restaurant manager, fast food

## 2024-07-09 NOTE — Assessment & Plan Note (Signed)
 This very pleasant 68 year old male with known knee osteoarthritis returns, x-rays confirmed arthritis per He has not improved with arthritis from Tylenol , meloxicam  taking daily for his wrist, compression and home physical therapy, today we did left knee injection. Return to see me 6 weeks. If insufficient improvement we will switch to viscosupplementation.

## 2024-07-10 ENCOUNTER — Other Ambulatory Visit: Payer: Self-pay | Admitting: Family Medicine

## 2024-07-10 DIAGNOSIS — E785 Hyperlipidemia, unspecified: Secondary | ICD-10-CM

## 2024-07-10 NOTE — Telephone Encounter (Signed)
 Spoke to Nathaniel Montes concerning Lisinopril  hydrochlorothiazide  20mg -12.5mg  [facility] recall. Pt states he recently received 2 orders of the 20-12.5mg . Advised Nathaniel Montes to return those medications to the pharmacy. He's to continue taking the 20-25mg  Rx.

## 2024-07-20 DIAGNOSIS — G4733 Obstructive sleep apnea (adult) (pediatric): Secondary | ICD-10-CM | POA: Diagnosis not present

## 2024-07-30 ENCOUNTER — Other Ambulatory Visit: Payer: Self-pay | Admitting: Family Medicine

## 2024-07-30 DIAGNOSIS — M25532 Pain in left wrist: Secondary | ICD-10-CM

## 2024-08-04 ENCOUNTER — Encounter: Payer: Self-pay | Admitting: Sports Medicine

## 2024-08-20 ENCOUNTER — Ambulatory Visit: Admitting: Sports Medicine

## 2024-08-27 ENCOUNTER — Ambulatory Visit (INDEPENDENT_AMBULATORY_CARE_PROVIDER_SITE_OTHER): Admitting: Sports Medicine

## 2024-08-27 ENCOUNTER — Encounter: Payer: Self-pay | Admitting: Sports Medicine

## 2024-08-27 VITALS — BP 138/78 | Ht 70.0 in | Wt 315.0 lb

## 2024-08-27 DIAGNOSIS — M1712 Unilateral primary osteoarthritis, left knee: Secondary | ICD-10-CM

## 2024-08-27 NOTE — Progress Notes (Signed)
 Patient ID: MAURIZIO GENO, male   DOB: 1955/12/27, 68 y.o.   MRN: [id number]  Mr. Ellers presents today for follow-up on left knee osteoarthritis.  Recent x-rays show advanced medial compartmental DJD.  Cortisone injection administered approximately 6 weeks ago provided him with about 4 weeks of good pain relief.  Unfortunately, pain has now returned.  At this point I have recommended that we start a round of viscosupplementation.  We will work on prior authorization with his insurance company, and he will follow-up once the request has been approved.  This note was dictated using Dragon naturally speaking software and may contain errors in syntax, spelling, or content which have not been identified prior to signing this note.

## 2024-08-31 ENCOUNTER — Telehealth: Payer: Self-pay | Admitting: *Deleted

## 2024-08-31 NOTE — Telephone Encounter (Signed)
 Pt informed of below.  Durolane ordered with [place].    The plan covers 80% of the allowable amount for DUROLANE J7318 and 100% of the allowable amount for procedure 20610/20611.   Deductibles do not apply to these services.   Copay: $30   Only one copay applies per date of service. If out-of-pocket is met, coverage goes to 100%, and copay will no longer apply. No pre-cert, or referral needed. As of 09/03/2023, [employer] Medicare will no longer require a pre-cert for the drug Durolane [phone]).   Medical notes may be requested upon the time of claim processing.  Provider Redell Barter [place], NPI [id number] and TIN [id number] is OUT of  network. Practice NPI, [id number] is pulling up other individual provider. However practice [facility] is showing to be in network under another mentioned NPI: [id number].

## 2024-09-01 NOTE — Progress Notes (Signed)
 IKE MARAGH                                          MRN: [id number]   09/01/2024   The VBCI Quality Team Specialist reviewed this patient medical record for the purposes of chart review for care gap closure. The following were reviewed: chart review for care gap closure-kidney health evaluation for diabetes:eGFR  and uACR.    VBCI Quality Team

## 2024-09-02 ENCOUNTER — Ambulatory Visit

## 2024-09-03 ENCOUNTER — Ambulatory Visit

## 2024-09-03 ENCOUNTER — Other Ambulatory Visit (INDEPENDENT_AMBULATORY_CARE_PROVIDER_SITE_OTHER): Payer: Self-pay

## 2024-09-03 VITALS — BP 120/60 | Ht 70.0 in | Wt 315.0 lb

## 2024-09-03 DIAGNOSIS — M1712 Unilateral primary osteoarthritis, left knee: Secondary | ICD-10-CM

## 2024-09-03 DIAGNOSIS — L4050 Arthropathic psoriasis, unspecified: Secondary | ICD-10-CM | POA: Insufficient documentation

## 2024-09-03 MED ORDER — SODIUM HYALURONATE 60 MG/3ML IX PRSY
60.0000 mg | PREFILLED_SYRINGE | Freq: Once | INTRA_ARTICULAR | Status: AC
Start: 2024-09-03 — End: 2024-09-03
  Administered 2024-09-03: 60 mg via INTRA_ARTICULAR

## 2024-09-03 NOTE — Progress Notes (Signed)
   Subjective:    Patient ID: Nathaniel Montes, male    DOB: 68 y.o., 25-Jul-1956   MRN: [id number]  HPI  Purpose of visit: Left knee Durolane injection  68 year old male with prior history of right knee TKA and left knee osteoarthritis presenting for a left knee Durolane injection.  Objective:   Physical Exam There were no vitals filed for this visit.  Intra-articular Knee Hyaluronic Acid Injection/Aspiration with Ultrasound Guidance Procedure Note Nathaniel Montes 1956/02/24 Indications: Pain Procedure Details Following the description of risks including infection bleeding, damage to surrounding structures, patient provided verbal/written consent for left knee injection procedure. Palpated for the superior-lateral joint space and under US  guidance. Patient was sterilely prepped in the usual fashion with chlorhexidine.  Following tract anesthesia with 3cc of Lidocaine 1% buffered with 0.5cc Sodium Bicarbonate 8.4% with a 25 gauge needle, an 18 gauge needle was inserted which aspirated 14 mg cc's of serous appearing fluid prior to injection of visco-supplementation. Patient tolerated well without complication.  Precautions provided.       Assessment & Plan:   Nathaniel Montes is a 68 y.o. male with history of osteoarthritis of his left knee status post right knee TKA presenting for hyaluronic acid injection.  He underwent hyaluronic acid injection preceded by aspiration of the knee without complication.  Follow-up as needed at this point but tentatively can plan for repeat injection in 6 months.

## 2024-09-15 ENCOUNTER — Telehealth: Payer: Self-pay

## 2024-09-15 NOTE — Telephone Encounter (Signed)
 Completed refill order form for Ozempic  and faxed to provider's office for review and signature.

## 2024-10-06 ENCOUNTER — Encounter: Payer: Self-pay | Admitting: Family Medicine

## 2024-10-06 ENCOUNTER — Ambulatory Visit (INDEPENDENT_AMBULATORY_CARE_PROVIDER_SITE_OTHER): Admitting: Family Medicine

## 2024-10-06 VITALS — BP 134/70 | HR 71 | Ht 70.0 in | Wt 308.0 lb

## 2024-10-06 DIAGNOSIS — E785 Hyperlipidemia, unspecified: Secondary | ICD-10-CM | POA: Diagnosis not present

## 2024-10-06 DIAGNOSIS — I152 Hypertension secondary to endocrine disorders: Secondary | ICD-10-CM | POA: Diagnosis not present

## 2024-10-06 DIAGNOSIS — E1159 Type 2 diabetes mellitus with other circulatory complications: Secondary | ICD-10-CM

## 2024-10-06 DIAGNOSIS — G2581 Restless legs syndrome: Secondary | ICD-10-CM

## 2024-10-06 DIAGNOSIS — E1169 Type 2 diabetes mellitus with other specified complication: Secondary | ICD-10-CM | POA: Diagnosis not present

## 2024-10-06 DIAGNOSIS — R972 Elevated prostate specific antigen [PSA]: Secondary | ICD-10-CM

## 2024-10-06 DIAGNOSIS — Z23 Encounter for immunization: Secondary | ICD-10-CM | POA: Diagnosis not present

## 2024-10-06 DIAGNOSIS — F419 Anxiety disorder, unspecified: Secondary | ICD-10-CM | POA: Diagnosis not present

## 2024-10-06 DIAGNOSIS — I10 Essential (primary) hypertension: Secondary | ICD-10-CM | POA: Diagnosis not present

## 2024-10-06 LAB — POCT GLYCOSYLATED HEMOGLOBIN (HGB A1C): HbA1c, POC (controlled diabetic range): 5.8 % (ref 0.0–7.0)

## 2024-10-06 NOTE — Assessment & Plan Note (Signed)
 Diabetes remains fairly well-controlled.  He will continue Ozempic  at current strength.  Continued dietary changes encouraged.

## 2024-10-06 NOTE — Assessment & Plan Note (Signed)
Doing well with simvastatin.  Recommend continuation. ?

## 2024-10-06 NOTE — Assessment & Plan Note (Signed)
 Still with some RLS symptoms.  Continue Requip  at current strength.

## 2024-10-06 NOTE — Progress Notes (Signed)
 Nathaniel Montes - 68 y.o. male MRN [id number]  Date of birth: 17-Apr-1956  Subjective Chief Complaint  Patient presents with   Hypertension   Diabetes    HPI Nathaniel Montes is a 68 y.o. male here today for follow up visit.   He reports that he is doing well.   He remains on Ozempic  at 0.5mg .  He is currently on patient assistance for Ozempic  which will expire at the end of the year.  His blood sugars remain well controlled at 5.8%.  His weight is down a little since last visit as well.   BP is well controlled with current medications.  Denies side effects at current strength.  He has not had chest pain, shortness of breath, palpitations, headaches or vision changes.  RLS symptoms are stable with Requip  at current strength.  Mood is stable with Paxil  at current strength.  ROS:  A comprehensive ROS was completed and negative except as noted per HPI  No Known Allergies  Past Medical History:  Diagnosis Date   Anxiety 11/17/2012   Arthritis    LEFT WRIST   HYPERLIPIDEMIA 11/29/2009   HYPERTENSION 11/29/2009   Psoriasis 11/17/2012    Past Surgical History:  Procedure Laterality Date   APPENDECTOMY     COLONOSCOPY  08/16/2010   2008   LEG SURGERY Left    broken bone left lower leg, repaired   meniscus left knee Left    repair   POLYPECTOMY     REPLACEMENT TOTAL KNEE Right 12/2018   SHOULDER SURGERY Right    torn bicep, rotator cuff, ligament work injury    Social History   Socioeconomic History   Marital status: Married    Spouse name: Tracie   Number of children: 2   Years of education: 12   Highest education level: 12th grade  Occupational History    Comment: Retired  Tobacco Use   Smoking status: Former    Current packs/day: 0.00    Types: Cigarettes    Quit date: 1996    Years since quitting: 29.8   Smokeless tobacco: Never  Vaping Use   Vaping status: Never Used  Substance and Sexual Activity   Alcohol use: Yes    Comment: 2 case of beer per  week   Drug use: No   Sexual activity: Not on file  Other Topics Concern   Not on file  Social History Narrative   Lives with wife and his youngest son. He hasn't been able to do much exercise lately. He is starting to go camping soon.   Social Drivers of Corporate Investment Banker Strain: Low Risk  (05/26/2024)   Overall Financial Resource Strain (CARDIA)    Difficulty of Paying Living Expenses: Not hard at all  Food Insecurity: No Food Insecurity (05/26/2024)   Hunger Vital Sign    Worried About Running Out of Food in the Last Year: Never true    Ran Out of Food in the Last Year: Never true  Transportation Needs: No Transportation Needs (06/20/2021)   PRAPARE - Administrator, Civil Service (Medical): No    Lack of Transportation (Non-Medical): No  Physical Activity: Inactive (05/26/2024)   Exercise Vital Sign    Days of Exercise per Week: 0 days    Minutes of Exercise per Session: 0 min  Stress: No Stress Concern Present (05/26/2024)   [employer] of Occupational Health - Occupational Stress Questionnaire    Feeling of Stress: Not at all  Social Connections: Moderately Isolated (05/26/2024)   Social Connection and Isolation Panel    Frequency of Communication with Friends and Family: Three times a week    Frequency of Social Gatherings with Friends and Family: Twice a week    Attends Religious Services: Never    Database Administrator or Organizations: No    Attends Engineer, Structural: Never    Marital Status: Married    Family History  Problem Relation Age of Onset   Bladder Cancer Mother    Hypertension Father    Healthy Sister    Healthy Sister    Healthy Brother    Colon cancer Neg Hx    Colon polyps Neg Hx    Esophageal cancer Neg Hx    Rectal cancer Neg Hx    Stomach cancer Neg Hx    Crohn's disease Neg Hx    Ulcerative colitis Neg Hx     Health Maintenance  Topic Date Due   Medicare Annual Wellness (AWV)  06/20/2022   Diabetic  kidney evaluation - eGFR measurement  07/15/2024   COVID-19 Vaccine (4 - 2025-26 season) 10/22/2025 (Originally 08/03/2024)   OPHTHALMOLOGY EXAM  03/13/2025   HEMOGLOBIN A1C  04/05/2025   Diabetic kidney evaluation - Urine ACR  05/26/2025   FOOT EXAM  05/26/2025   DTaP/Tdap/Td (3 - Td or Tdap) 10/18/2027   Colonoscopy  04/29/2028   Pneumococcal Vaccine: 50+ Years  Completed   Influenza Vaccine  Completed   Zoster Vaccines- Shingrix  Completed   Meningococcal B Vaccine  Aged Out   Hepatitis C Screening  Discontinued     ----------------------------------------------------------------------------------------------------------------------------------------------------------------------------------------------------------------- Physical Exam BP 134/70   Pulse 71   Ht 5' 10 (1.778 m)   Wt (!) 308 lb (139.7 kg)   SpO2 94%   BMI 44.19 kg/m   Physical Exam Constitutional:      Appearance: Normal appearance.  HENT:     Head: Normocephalic and atraumatic.  Eyes:     General: No scleral icterus. Cardiovascular:     Rate and Rhythm: Normal rate and regular rhythm.  Pulmonary:     Effort: Pulmonary effort is normal.     Breath sounds: Normal breath sounds.  Musculoskeletal:     Cervical back: Neck supple.  Neurological:     Mental Status: He is alert.  Psychiatric:        Mood and Affect: Mood normal.        Behavior: Behavior normal.     ------------------------------------------------------------------------------------------------------------------------------------------------------------------------------------------------------------------- Assessment and Plan  Hypertension associated with diabetes (HCC) Blood pressure is well-controlled at this time.  Recommend continuation of current medications for management of hypertension.  Type 2 diabetes mellitus with other specified complication (HCC) Diabetes remains fairly well-controlled.  He will continue Ozempic  at  current strength.  Continued dietary changes encouraged.  Hyperlipidemia with target low density lipoprotein (LDL) cholesterol less than 130 mg/dL Doing well with simvastatin . Recommend continuation.   Anxiety Remains well controlled with Paxil .  Continue at current strength.   RLS (restless legs syndrome) Still with some RLS symptoms.  Continue Requip  at current strength.   No orders of the defined types were placed in this encounter.   Return in about 4 months (around 02/03/2025) for Type 2 Diabetes, Hypertension.

## 2024-10-06 NOTE — Assessment & Plan Note (Signed)
Remains well controlled with Paxil.  Continue at current strength.

## 2024-10-06 NOTE — Assessment & Plan Note (Addendum)
Blood pressure is well-controlled at this time.  Recommend continuation of current medications for management of hypertension.   

## 2024-10-07 LAB — CMP14+EGFR
ALT: 32 IU/L (ref 0–44)
AST: 29 IU/L (ref 0–40)
Albumin: 4.5 g/dL (ref 3.9–4.9)
Alkaline Phosphatase: 74 IU/L (ref 47–123)
BUN/Creatinine Ratio: 13 (ref 10–24)
BUN: 14 mg/dL (ref 8–27)
Bilirubin Total: 0.6 mg/dL (ref 0.0–1.2)
CO2: 21 mmol/L (ref 20–29)
Calcium: 9.9 mg/dL (ref 8.6–10.2)
Chloride: 94 mmol/L — ABNORMAL LOW (ref 96–106)
Creatinine, Ser: 1.07 mg/dL (ref 0.76–1.27)
Globulin, Total: 2.6 g/dL (ref 1.5–4.5)
Glucose: 114 mg/dL — ABNORMAL HIGH (ref 70–99)
Potassium: 5 mmol/L (ref 3.5–5.2)
Sodium: 132 mmol/L — ABNORMAL LOW (ref 134–144)
Total Protein: 7.1 g/dL (ref 6.0–8.5)
eGFR: 75 mL/min/1.73 (ref >59)

## 2024-10-07 LAB — CBC WITH DIFFERENTIAL/PLATELET
Basophils Absolute: 0.1 x10E3/uL (ref 0.0–0.2)
Basos: 1 %
EOS (ABSOLUTE): 0.1 x10E3/uL (ref 0.0–0.4)
Eos: 1 %
Hematocrit: 39.2 % (ref 37.5–51.0)
Hemoglobin: 13.4 g/dL (ref 13.0–17.7)
Immature Grans (Abs): 0 x10E3/uL (ref 0.0–0.1)
Immature Granulocytes: 0 %
Lymphocytes Absolute: 2.1 x10E3/uL (ref 0.7–3.1)
Lymphs: 24 %
MCH: 34.2 pg — ABNORMAL HIGH (ref 26.6–33.0)
MCHC: 34.2 g/dL (ref 31.5–35.7)
MCV: 100 fL — ABNORMAL HIGH (ref 79–97)
Monocytes Absolute: 0.9 x10E3/uL (ref 0.1–0.9)
Monocytes: 10 %
Neutrophils Absolute: 5.6 x10E3/uL (ref 1.4–7.0)
Neutrophils: 64 %
Platelets: 276 x10E3/uL (ref 150–450)
RBC: 3.92 x10E6/uL — ABNORMAL LOW (ref 4.14–5.80)
RDW: 12.7 % (ref 11.6–15.4)
WBC: 8.7 x10E3/uL (ref 3.4–10.8)

## 2024-10-07 LAB — LIPID PANEL WITH LDL/HDL RATIO
Cholesterol, Total: 136 mg/dL (ref 100–199)
HDL: 51 mg/dL (ref >39)
LDL Chol Calc (NIH): 70 mg/dL (ref 0–99)
LDL/HDL Ratio: 1.4 ratio (ref 0.0–3.6)
Triglycerides: 78 mg/dL (ref 0–149)
VLDL Cholesterol Cal: 15 mg/dL (ref 5–40)

## 2024-10-07 LAB — PSA: Prostate Specific Ag, Serum: 5.1 ng/mL — ABNORMAL HIGH (ref 0.0–4.0)

## 2024-10-16 NOTE — Progress Notes (Signed)
 Nathaniel Montes                                          MRN: [id number]   10/16/2024   The VBCI Quality Team Specialist reviewed this patient medical record for the purposes of chart review for care gap closure. The following were reviewed: abstraction for care gap closure-controlling blood pressure.    VBCI Quality Team

## 2024-10-19 DIAGNOSIS — G4733 Obstructive sleep apnea (adult) (pediatric): Secondary | ICD-10-CM | POA: Diagnosis not present

## 2024-10-21 ENCOUNTER — Ambulatory Visit: Payer: Self-pay | Admitting: Family Medicine

## 2024-10-21 DIAGNOSIS — R972 Elevated prostate specific antigen [PSA]: Secondary | ICD-10-CM

## 2024-11-12 ENCOUNTER — Encounter: Admitting: Family Medicine

## 2024-11-12 ENCOUNTER — Ambulatory Visit (INDEPENDENT_AMBULATORY_CARE_PROVIDER_SITE_OTHER)

## 2024-11-12 VITALS — BP 137/79 | HR 85 | Ht 70.0 in | Wt 311.0 lb

## 2024-11-12 DIAGNOSIS — Z0000 Encounter for general adult medical examination without abnormal findings: Secondary | ICD-10-CM | POA: Diagnosis not present

## 2024-11-12 NOTE — Patient Instructions (Signed)
 Nathaniel Montes,  Thank you for taking the time for your Medicare Wellness Visit. I appreciate your continued commitment to your health goals. Please review the care plan we discussed, and feel free to reach out if I can assist you further.  Please note that Annual Wellness Visits do not include a physical exam. Some assessments may be limited, especially if the visit was conducted virtually. If needed, we may recommend an in-person follow-up with your provider.  Ongoing Care Seeing your primary care provider every 3 to 6 months helps us  monitor your health and provide consistent, personalized care.   Referrals If a referral was made during today's visit and you haven't received any updates within two weeks, please contact the referred provider directly to check on the status.  Recommended Screenings:  Health Maintenance  Topic Date Due   Medicare Annual Wellness Visit  06/20/2022   COVID-19 Vaccine (4 - 2025-26 season) 10/22/2025*   Eye exam for diabetics  03/13/2025   Hemoglobin A1C  04/05/2025   Yearly kidney health urinalysis for diabetes  05/26/2025   Complete foot exam   05/26/2025   Yearly kidney function blood test for diabetes  10/06/2025   DTaP/Tdap/Td vaccine (3 - Td or Tdap) 10/18/2027   Colon Cancer Screening  04/29/2028   Pneumococcal Vaccine for age over 63  Completed   Flu Shot  Completed   Zoster (Shingles) Vaccine  Completed   Meningitis B Vaccine  Aged Out   Hepatitis C Screening  Discontinued  *Topic was postponed. The date shown is not the original due date.       11/12/2024    8:51 AM  Advanced Directives  Does Patient Have a Medical Advance Directive? Yes  Type of Estate Agent of [place];Living will  Does patient want to make changes to medical advance directive? No - Patient declined    Vision: Annual vision screenings are recommended for early detection of glaucoma, cataracts, and diabetic retinopathy. These exams can also reveal  signs of chronic conditions such as diabetes and high blood pressure.  Dental: Annual dental screenings help detect early signs of oral cancer, gum disease, and other conditions linked to overall health, including heart disease and diabetes.  Please see the attached documents for additional preventive care recommendations.

## 2024-11-12 NOTE — Progress Notes (Signed)
 Chief Complaint  Patient presents with   Medicare Wellness     Subjective:   Nathaniel Montes is a 68 y.o. male who presents for a Medicare Annual Wellness Visit.  Visit info / Clinical Intake: Medicare Wellness Visit Type:: Subsequent Annual Wellness Visit Persons participating in visit and providing information:: patient Medicare Wellness Visit Mode:: In-person (required for WTM) Interpreter Needed?: No Pre-visit prep was completed: yes AWV questionnaire completed by patient prior to visit?: no Living arrangements:: lives with spouse/significant other Patient's Overall Health Status Rating: good Typical amount of pain: none Does pain affect daily life?: no Are you currently prescribed opioids?: no  Dietary Habits and Nutritional Risks How many meals a day?: 2 Eats fruit and vegetables daily?: yes Most meals are obtained by: preparing own meals In the last 2 weeks, have you had any of the following?: none Diabetic:: (!) yes Any non-healing wounds?: no How often do you check your BS?: as needed Would you like to be referred to a Nutritionist or for Diabetic Management? : no  Functional Status Activities of Daily Living (to include ambulation/medication): Independent Ambulation: Independent Medication Administration: Independent Home Management (perform basic housework or laundry): Independent Manage your own finances?: yes Primary transportation is: driving Concerns about vision?: no *vision screening is required for WTM* Concerns about hearing?: no  Fall Screening Falls in the past year?: 0 Number of falls in past year: 0 Was there an injury with Fall?: 0 Fall Risk Category Calculator: 0 Patient Fall Risk Level: Low Fall Risk  Fall Risk Patient at Risk for Falls Due to: No Fall Risks Fall risk Follow up: Falls evaluation completed  Home and Transportation Safety: All rugs have non-skid backing?: yes All stairs or steps have railings?: N/A, no stairs Grab  bars in the bathtub or shower?: yes Have non-skid surface in bathtub or shower?: yes Good home lighting?: yes Regular seat belt use?: yes Hospital stays in the last year:: no  Cognitive Assessment Difficulty concentrating, remembering, or making decisions? : no Will 6CIT or Mini Cog be Completed: yes What year is it?: 0 points What month is it?: 0 points Give patient an address phrase to remember (5 components): [address] [place], [place] [address] About what time is it?: 0 points Count backwards from 20 to 1: 0 points Say the months of the year in reverse: 0 points Repeat the address phrase from earlier: 0 points 6 CIT Score: 0 points  Advance Directives (For Healthcare) Does Patient Have a Medical Advance Directive?: Yes Does patient want to make changes to medical advance directive?: No - Patient declined Type of Advance Directive: Healthcare Power of [place]; Living will  Reviewed/Updated  Reviewed/Updated: Reviewed All (Medical, Surgical, Family, Medications, Allergies, Care Teams, Patient Goals)    Allergies (verified) Patient has no known allergies.   Current Medications (verified) Outpatient Encounter Medications as of 11/12/2024  Medication Sig   acetaminophen  (TYLENOL ) 650 MG CR tablet Take 1 tablet (650 mg total) by mouth every 8 (eight) hours as needed for pain.   AMBULATORY NON FORMULARY MEDICATION Please provide home Bipap machine with pressure settings of 25/ 21 cm H2O  Please provide: Large size [employer] Full Face Evora Full mask, tubing, filters and heated humidification.   amLODipine  (NORVASC ) 10 MG tablet Take 1 tablet (10 mg total) by mouth daily.   BABY ASPIRIN PO Take 81 mg by mouth daily.   lisinopril -hydrochlorothiazide  (ZESTORETIC ) 20-25 MG tablet TAKE 1 TABLET BY MOUTH EVERY DAY   meloxicam  (MOBIC ) 15  MG tablet TAKE 1 TABLET (15 MG TOTAL) BY MOUTH DAILY.   PARoxetine  (PAXIL ) 20 MG tablet TAKE 1 TABLET BY MOUTH EVERY DAY   Risankizumab-rzaa  (SKYRIZI) 150 MG/ML SOSY Inject into the skin. ONCE EVERY  3 MONTHS   rOPINIRole  (REQUIP ) 1 MG tablet Take 1 tablet (1 mg total) by mouth at bedtime. TAKE 1/2 TO 1 TAB AT BEDTIME AS NEEDED FOR RESTLESS LEG   Semaglutide ,0.25 or 0.5MG /DOS, (OZEMPIC , 0.25 OR 0.5 MG/DOSE,) 2 MG/3ML SOPN Inject 0.5mg  once weekly   simvastatin  (ZOCOR ) 80 MG tablet TAKE 1 TABLET BY MOUTH EVERY DAY   No facility-administered encounter medications on file as of 11/12/2024.    History: Past Medical History:  Diagnosis Date   Anxiety 11/17/2012   Arthritis    LEFT WRIST   HYPERLIPIDEMIA 11/29/2009   HYPERTENSION 11/29/2009   Psoriasis 11/17/2012   Past Surgical History:  Procedure Laterality Date   APPENDECTOMY     COLONOSCOPY  08/16/2010   2008   LEG SURGERY Left    broken bone left lower leg, repaired   meniscus left knee Left    repair   POLYPECTOMY     REPLACEMENT TOTAL KNEE Right 12/2018   SHOULDER SURGERY Right    torn bicep, rotator cuff, ligament work injury   Family History  Problem Relation Age of Onset   Bladder Cancer Mother    Hypertension Father    Healthy Sister    Healthy Sister    Healthy Brother    Colon cancer Neg Hx    Colon polyps Neg Hx    Esophageal cancer Neg Hx    Rectal cancer Neg Hx    Stomach cancer Neg Hx    Crohn's disease Neg Hx    Ulcerative colitis Neg Hx    Social History   Occupational History    Comment: Retired  Tobacco Use   Smoking status: Former    Current packs/day: 0.00    Types: Cigarettes    Quit date: 1996    Years since quitting: 29.9   Smokeless tobacco: Never  Vaping Use   Vaping status: Never Used  Substance and Sexual Activity   Alcohol use: Yes    Alcohol/week: 50.0 standard drinks of alcohol    Types: 50 Cans of beer per week    Comment: 2 case of beer per week   Drug use: No   Sexual activity: Not on file   Tobacco Counseling Counseling given: Not Answered  SDOH Screenings   Food Insecurity: No Food Insecurity  (05/26/2024)  Housing: Low Risk (11/12/2024)  Transportation Needs: No Transportation Needs (11/12/2024)  Utilities: Not At Risk (11/12/2024)  Alcohol Screen: Low Risk (05/26/2024)  Depression (PHQ2-9): Low Risk (11/12/2024)  Financial Resource Strain: Low Risk (05/26/2024)  Physical Activity: Inactive (11/12/2024)  Social Connections: Moderately Integrated (11/12/2024)  Stress: No Stress Concern Present (11/12/2024)  Tobacco Use: Medium Risk (11/12/2024)  Health Literacy: Adequate Health Literacy (11/12/2024)   See flowsheets for full screening details  Depression Screen PHQ 2 & 9 Depression Scale- Over the past 2 weeks, how often have you been bothered by any of the following problems? Little interest or pleasure in doing things: 0 Feeling down, depressed, or hopeless (PHQ Adolescent also includes...irritable): 0 PHQ-2 Total Score: 0     Goals Addressed             This Visit's Progress    Patient Stated       Patient states he would like to lose weight.  Objective:    Today's Vitals   11/12/24 0842 11/12/24 0905  BP: (!) 160/69 137/79  Pulse: 85   SpO2: 95%   Weight: (!) 311 lb (141.1 kg)   Height: 5' 10 (1.778 m)    Body mass index is 44.62 kg/m.  Hearing/Vision screen No results found. Immunizations and Health Maintenance Health Maintenance  Topic Date Due   COVID-19 Vaccine (4 - 2025-26 season) 10/22/2025 (Originally 08/03/2024)   OPHTHALMOLOGY EXAM  03/13/2025   HEMOGLOBIN A1C  04/05/2025   Diabetic kidney evaluation - Urine ACR  05/26/2025   FOOT EXAM  05/26/2025   Diabetic kidney evaluation - eGFR measurement  10/06/2025   Medicare Annual Wellness (AWV)  11/12/2025   DTaP/Tdap/Td (3 - Td or Tdap) 10/18/2027   Colonoscopy  04/29/2028   Pneumococcal Vaccine: 50+ Years  Completed   Influenza Vaccine  Completed   Zoster Vaccines- Shingrix  Completed   Meningococcal B Vaccine  Aged Out   Hepatitis C Screening  Discontinued         Assessment/Plan:  This is a routine wellness examination for Yonah.  Patient Care Team: Alvia Bring, DO as PCP - General (Family Medicine) Claudene Loving, MD as Referring Physician (Dermatology)  I have personally reviewed and noted the following in the patients chart:   Medical and social history Use of alcohol, tobacco or illicit drugs  Current medications and supplements including opioid prescriptions. Functional ability and status Nutritional status Physical activity Advanced directives List of other physicians Hospitalizations, surgeries, and ER visits in previous 12 months Vitals Screenings to include cognitive, depression, and falls Referrals and appointments  No orders of the defined types were placed in this encounter.  In addition, I have reviewed and discussed with patient certain preventive protocols, quality metrics, and best practice recommendations. A written personalized care plan for preventive services as well as general preventive health recommendations were provided to patient.   Bonny Jon Mayor, CMA   11/12/2024   Return in 1 year (on 11/12/2025).  After Visit Summary: (In Person-Declined) Patient declined AVS at this time.  Nurse Notes:   PAARTH CROPPER is a 68 y.o. male patient of Bring Alvia, DO who had a Medicare Annual Wellness Visit today. He reports that he is socially active and does interact with friends/family regularly. He is minimally  physically active. He enjoys camping.

## 2024-11-13 NOTE — Progress Notes (Signed)
 Nathaniel Montes                                          MRN: [id number]   11/13/2024   The [facility] Quality Team Specialist reviewed this patient medical record for the purposes of chart review for care gap closure. The following were reviewed: abstraction for care gap closure-kidney health evaluation for diabetes:eGFR  and uACR.    [facility] Quality Team

## 2024-11-15 NOTE — Progress Notes (Unsigned)
 Chief Complaint: Abnormal PSA result  History of Present Illness:  68 yo male presents for E/M of elevated PSA. Prior data- 8.13.2024--4.5 10.15.2024--3.7 11.10.2025--5.1  He has no prior urologic history.  There are no men in his family with BPH, prostate problems or prostate cancer.  No significant lower urinary tract symptoms.  IPSS 2/0  Past Medical History:  Past Medical History:  Diagnosis Date   Anxiety 11/17/2012   Arthritis    LEFT WRIST   HYPERLIPIDEMIA 11/29/2009   HYPERTENSION 11/29/2009   Psoriasis 11/17/2012    Past Surgical History:  Past Surgical History:  Procedure Laterality Date   APPENDECTOMY     COLONOSCOPY  08/16/2010   2008   LEG SURGERY Left    broken bone left lower leg, repaired   meniscus left knee Left    repair   POLYPECTOMY     REPLACEMENT TOTAL KNEE Right 12/2018   SHOULDER SURGERY Right    torn bicep, rotator cuff, ligament work injury    Allergies:  Allergies[1]  Family History:  Family History  Problem Relation Age of Onset   Bladder Cancer Mother    Hypertension Father    Healthy Sister    Healthy Sister    Healthy Brother    Colon cancer Neg Hx    Colon polyps Neg Hx    Esophageal cancer Neg Hx    Rectal cancer Neg Hx    Stomach cancer Neg Hx    Crohn's disease Neg Hx    Ulcerative colitis Neg Hx     Social History:  Social History[2]  Review of symptoms:  Constitutional:  Negative for unexplained weight loss, night sweats, fever, chills ENT:  Negative for nose bleeds, sinus pain, painful swallowing CV:  Negative for chest pain, shortness of breath, exercise intolerance, palpitations, loss of consciousness Resp:  Negative for cough, wheezing, shortness of breath GI:  Negative for nausea, vomiting, diarrhea, bloody stools GU:  Positives noted in HPI; otherwise negative for gross hematuria, dysuria, urinary incontinence Neuro:  Negative for seizures, poor balance, limb weakness, slurred speech Psych:   Negative for lack of energy, depression, anxiety Endocrine:  Negative for polydipsia, polyuria, symptoms of hypoglycemia (dizziness, hunger, sweating) Hematologic:  Negative for anemia, purpura, petechia, prolonged or excessive bleeding, use of anticoagulants  Allergic:  Negative for difficulty breathing or choking as a result of exposure to anything; no shellfish allergy; no allergic response (rash/itch) to materials, foods  Physical exam: There were no vitals taken for this visit. GENERAL APPEARANCE:  Well appearing, well developed, well nourished, NAD HEENT: Atraumatic, Normocephalic. NECK: Normal appearance LUNGS: Normal inspiratory and expiratory excursion HEART: Regular Rate ABDOMEN: Obese, no inguinal hernias GU: Phallus normal, no lesions. Scrotal skin normal. Testicles/epididymal structures normal. Meatus normal. Normal anal sphincter tone, anal canal elongated.  I could only palpate the apex of the prostate which felt normal. EXTREMITIES: Moves all extremities well.  Without clubbing, cyanosis, or edema. NEUROLOGIC:  Alert and oriented x 3, normal gait, CN II-XII grossly intact.  MENTAL STATUS:  Appropriate. SKIN:  Warm, dry and intact.    Results:  I have reviewed referring/prior physicians notes  I have reviewed urinalysis--clear  I have reviewed PSA results  I have reviewed bladder scan  Assessment: Elevated PSA.  Borderline high and this 68 year old male.  Incomplete prostate exam   Plan: -Causative factors of PSA elevation discussed with patient  - We will send out blood for iso-PSA.  Follow-up dependent on that     [  1] No Known Allergies [2]  Social History Tobacco Use   Smoking status: Former    Current packs/day: 0.00    Types: Cigarettes    Quit date: 1996    Years since quitting: 29.9   Smokeless tobacco: Never  Vaping Use   Vaping status: Never Used  Substance Use Topics   Alcohol use: Yes    Alcohol/week: 50.0 standard drinks of alcohol     Types: 50 Cans of beer per week    Comment: 2 case of beer per week   Drug use: No

## 2024-11-16 ENCOUNTER — Ambulatory Visit: Admitting: Urology

## 2024-11-16 VITALS — BP 163/85 | HR 77 | Ht 70.0 in | Wt 311.0 lb

## 2024-11-16 DIAGNOSIS — R972 Elevated prostate specific antigen [PSA]: Secondary | ICD-10-CM | POA: Diagnosis not present

## 2024-11-16 LAB — URINALYSIS, ROUTINE W REFLEX MICROSCOPIC
Bilirubin, UA: NEGATIVE
Glucose, UA: NEGATIVE
Ketones, UA: NEGATIVE
Leukocytes,UA: NEGATIVE
Nitrite, UA: NEGATIVE
Protein,UA: NEGATIVE
RBC, UA: NEGATIVE
Specific Gravity, UA: 1.025 (ref 1.005–1.030)
Urobilinogen, Ur: 0.2 mg/dL (ref 0.2–1.0)
pH, UA: 6 (ref 5.0–7.5)

## 2024-11-18 DIAGNOSIS — L408 Other psoriasis: Secondary | ICD-10-CM | POA: Diagnosis not present

## 2024-11-18 DIAGNOSIS — L400 Psoriasis vulgaris: Secondary | ICD-10-CM | POA: Diagnosis not present

## 2024-11-18 DIAGNOSIS — Z79899 Other long term (current) drug therapy: Secondary | ICD-10-CM | POA: Diagnosis not present

## 2024-11-21 ENCOUNTER — Other Ambulatory Visit: Payer: Self-pay | Admitting: Family Medicine

## 2024-11-23 NOTE — Telephone Encounter (Signed)
 Copied from CRM #[id number]. Topic: Clinical - Medication Question >> Nov 23, 2024 10:26 AM Montie POUR wrote: Reason for CRM:  Mr. Gaida is checking on refill for rOPINIRole  (REQUIP ) 1 MG tablet. He has 6 tablets left.  His charts shows: 11/21/24 0806 Reorder Interface, Surescripts Out To Nmizm:[id number] But it has not been sent to his pharmacy: Sent to pharmacy as: rOPINIRole  (REQUIP ) 1 MG tablet  E-Prescribing Status: Receipt confirmed by pharmacy (05/26/2024  9:47 AM EDT)   Please check into this. Thanks >> Nov 23, 2024 10:34 AM Montie POUR wrote: This is in his chart from 10/06/24 visit: He remains on Ozempic  at 0.5mg . He is currently on patient assistance for Ozempic  which will expire at the end of the year. His blood sugars remain well controlled at 5.8%. his is a note from 10/06/24 office visit:  Please check on order and call Mr. Runco at [phone].

## 2024-11-23 NOTE — Telephone Encounter (Signed)
 Ropinirole  1mg   Last written 05/26/2024 Last OV 11/04/225 Upcoming appt 02/03/2025

## 2024-11-24 ENCOUNTER — Encounter: Payer: Self-pay | Admitting: Urology

## 2024-12-02 ENCOUNTER — Other Ambulatory Visit: Payer: Self-pay | Admitting: Urology

## 2024-12-02 ENCOUNTER — Telehealth: Payer: Self-pay

## 2024-12-02 DIAGNOSIS — R972 Elevated prostate specific antigen [PSA]: Secondary | ICD-10-CM

## 2024-12-02 NOTE — Telephone Encounter (Signed)
-----   Message from Garnette Shack, MD sent at 12/02/2024 12:38 PM EST ----- Please call patient, the iso-PSA test result was about 17, which is higher than the cutoff of 6.  I would recommend an ultrasound and biopsy of the prostate.  Work on scheduling that please.  I put orders in.

## 2024-12-02 NOTE — Telephone Encounter (Signed)
 Pt notified of result, scheduled for biopsy and given prep instructions verbally and through mychart. Imaging notified.

## 2024-12-07 ENCOUNTER — Telehealth: Payer: Self-pay

## 2024-12-07 NOTE — Telephone Encounter (Signed)
 I checked the refrigerator. No PAP samples.

## 2024-12-07 NOTE — Telephone Encounter (Signed)
 Copied from CRM [id number]. Topic: Clinical - Prescription Issue >> Dec 07, 2024 12:18 PM Mercer PEDLAR wrote: Reason for CRM: Patient is checking status of refill for Ozempic  at 0.5mg . He stated he was supposed to get it before the end of the year due to the assistance program he is on but has not heard back. He stated he is out of meds and would like a callback for update.

## 2024-12-14 NOTE — Telephone Encounter (Signed)
 Patient came into office to get his medication (2 boxes of Ozempic ) logged into white binder in lab, thanks.

## 2024-12-14 NOTE — Telephone Encounter (Signed)
 We do have the 0.25 mg  - 0.5 mg. We have 3 boxes.

## 2024-12-14 NOTE — Telephone Encounter (Signed)
 Patient advised and he will pick up today. Placed his name on 2 boxes.

## 2024-12-15 ENCOUNTER — Other Ambulatory Visit: Payer: Self-pay

## 2024-12-15 DIAGNOSIS — M25532 Pain in left wrist: Secondary | ICD-10-CM

## 2024-12-15 DIAGNOSIS — E785 Hyperlipidemia, unspecified: Secondary | ICD-10-CM

## 2024-12-15 DIAGNOSIS — E1169 Type 2 diabetes mellitus with other specified complication: Secondary | ICD-10-CM

## 2024-12-15 DIAGNOSIS — F419 Anxiety disorder, unspecified: Secondary | ICD-10-CM

## 2024-12-15 DIAGNOSIS — I10 Essential (primary) hypertension: Secondary | ICD-10-CM

## 2024-12-15 DIAGNOSIS — I152 Hypertension secondary to endocrine disorders: Secondary | ICD-10-CM

## 2024-12-15 NOTE — Progress Notes (Unsigned)
 "  12/15/2024 Name: Nathaniel Montes MRN: [id number] DOB: 01/20/1956  Chief Complaint  Patient presents with   Diabetes Management Plan   Nathaniel Montes is a 69 y.o. year old male who presented for a telephone visit.   They were referred to the pharmacist by their PCP for assistance in managing medication access.   Subjective:  Care Team: Primary Care Provider: Alvia Bring, DO ; Next Scheduled Visit: 02/03/25  Medication Access/Adherence  Current [facility]acy:  [phone] [place], [place] - [address]  [place] [address] Phone: [phone] Fax: [phone]  Patient reports affordability concerns with their medications: Yes  Patient reports access/transportation concerns to their pharmacy: No  Patient reports adherence concerns with their medications:  Yes    Diabetes: Current medications: Ozempic  0.5mg  weekly -Patient well controlled on current regimen, but he will no longer receive Ozempic  through Novo PAP based on recent program changes.  He was just provided with 2 sample pens that will last him 8 weeks.  Macrovascular and Microvascular Risk Reduction:  Statin? yes (simvastatin  80mg ); ACEi/ARB? yes (lisinopril /hydrochlorothiazide  20/25mg ) Last urinary albumin/creatinine ratio:  Lab Results  Component Value Date   MICRALBCREAT <30 05/26/2024   Last eye exam:  Lab Results  Component Value Date   HMDIABEYEEXA  03/13/2024     Comment:     Unable to determine - Abstracted by HIM   Last foot exam: 05/26/2024 Tobacco Use:  Tobacco Use: Medium Risk (11/12/2024)   Patient History    Smoking Tobacco Use: Former    Smokeless Tobacco Use: Never    Passive Exposure: Not on file   Objective: Lab Results  Component Value Date   HGBA1C 5.8 10/06/2024   Lab Results  Component Value Date   CREATININE 1.07 10/06/2024   BUN 14 10/06/2024   NA 132 (L) 10/06/2024   K 5.0 10/06/2024   CL 94 (L) 10/06/2024   CO2 21 10/06/2024    Lab Results  Component Value Date   CHOL 136 10/06/2024   HDL 51 10/06/2024   LDLCALC 70 10/06/2024   TRIG 78 10/06/2024   CHOLHDL 3.0 07/12/2022   Medications Reviewed Today     Reviewed by Deanna Channing LABOR, RPH (Pharmacist) on 12/15/24 at 1417  Med List Status: <None>   Medication Order Taking? Sig Documenting Provider Last Dose Status Informant  acetaminophen  (TYLENOL ) 650 MG CR tablet [id number] Yes Take 1 tablet (650 mg total) by mouth every 8 (eight) hours as needed for pain. Curtis Debby PARAS, MD  Active   AMBULATORY JESSE SCHLOSSMAN MEDICATION [id number]  Please provide home Bipap machine with pressure settings of 25/ 21 cm H2O  Please provide: Large size [employer] Full Face Evora Full mask, tubing, filters and heated humidification. Alvia Bring, DO  Active   amLODipine  (NORVASC ) 10 MG tablet [id number] Yes Take 1 tablet (10 mg total) by mouth daily. Alvia Bring, DO  Active   BABY ASPIRIN PO [id number]  Take 81 mg by mouth daily. [provider]  Active Self  lisinopril -hydrochlorothiazide  (ZESTORETIC ) 20-25 MG tablet [id number] Yes TAKE 1 TABLET BY MOUTH EVERY DAY Matthews, Cody, DO  Active   meloxicam  (MOBIC ) 15 MG tablet [id number] Yes TAKE 1 TABLET (15 MG TOTAL) BY MOUTH DAILY. Alvia Bring, DO  Active   PARoxetine  (PAXIL ) 20 MG tablet [id number] Yes TAKE 1 TABLET BY MOUTH EVERY DAY Matthews, Cody, DO  Active   Risankizumab-rzaa [facility]) 150 MG/ML SOSY [id number] Yes Inject into the skin. ONCE EVERY  3 MONTHS [provider]  Active            Med Note ZENA Nathaniel A   Tue Dec 15, 2024  2:17 PM) PAP  rOPINIRole  (REQUIP ) 1 MG tablet [id number] Yes TAKE 1/2 TO 1 TAB AT BEDTIME AS NEEDED FOR RESTLESS LEG Alvia Bring, DO  Active   Semaglutide ,0.25 or 0.5MG /DOS, (OZEMPIC , 0.25 OR 0.5 MG/DOSE,) 2 MG/3ML SOPN [id number] Yes Inject 0.5mg  once weekly Alvia Bring, DO  Active            Med Note ZENA, Corry Storie A   Tue Dec 15, 2024  2:17 PM) samples   simvastatin  (ZOCOR ) 80 MG tablet [id number] Yes TAKE 1 TABLET BY MOUTH EVERY DAY Alvia Bring, DO  Active            Assessment/Plan:   Diabetes: -Currently controlled; goal A1c <7%. Cardiorenal risk reduction is optimized.. Blood pressure is not at goal <130/80. LDL is at goal.  -I recommend applying for [employer] PAP to see if patient will be approved for Trulicity.  Income cutoff is the same as Novo PAP, so he should qualify.  Patient is open to trying Trulicity; will verify PCP is and coordinate with medication assistance team to start application process if so.  I recommend patient start with Trulicity 1.5mg  weekly based on current Ozempic  dose of 0.5mg  weekly. -Continue to monitor BP; if consistently >130/80, consider increasing lisinopril /hydrochlorothiazide  to 40/25mg  daily -Patient would like to get medications [facility] for [facility] delivery.  [facility]inating [facility].  Follow Up Plan: Will monitor progress of PAP to keep patient/provider informed   Nathaniel Montes, PharmD, DPLA   "

## 2024-12-16 ENCOUNTER — Other Ambulatory Visit: Payer: Self-pay

## 2024-12-16 ENCOUNTER — Other Ambulatory Visit (HOSPITAL_COMMUNITY): Payer: Self-pay

## 2024-12-17 ENCOUNTER — Other Ambulatory Visit: Payer: Self-pay

## 2024-12-17 ENCOUNTER — Other Ambulatory Visit (HOSPITAL_COMMUNITY): Payer: Self-pay

## 2024-12-17 NOTE — Progress Notes (Unsigned)
" ° °  12/17/2024  Patient ID: Nathaniel Montes, male   DOB: 02-Mar-1956, 69 y.o.   MRN: [id number]  Incoming call from patient stating [facility] had only received [facility], and they are needing the other 4 he takes regularly on file to set him up for home delivery.  I contacted the pharmacy, and they have not received prescriptions for lisinopril /hydrochlorothiazide , amlodipine , meloxicam , paroxetine , or ropinorole.  Patient was last seen 11/4 with a follow-up scheduled for 3/4.  Orders for a 3 month supply with no additional refills for provider to sign if in agreement.  Channing DELENA Mealing, PharmD, DPLA  "

## 2024-12-18 ENCOUNTER — Other Ambulatory Visit (HOSPITAL_COMMUNITY): Payer: Self-pay

## 2024-12-18 ENCOUNTER — Other Ambulatory Visit: Payer: Self-pay

## 2024-12-18 MED ORDER — ROPINIROLE HCL 1 MG PO TABS
ORAL_TABLET | ORAL | 0 refills | Status: AC
  Filled 2024-12-18: qty 90, 90d supply, fill #0

## 2024-12-18 MED ORDER — MELOXICAM 15 MG PO TABS
15.0000 mg | ORAL_TABLET | Freq: Every day | ORAL | 0 refills | Status: AC
  Filled 2024-12-18: qty 90, 90d supply, fill #0

## 2024-12-18 MED ORDER — AMLODIPINE BESYLATE 10 MG PO TABS
10.0000 mg | ORAL_TABLET | Freq: Every day | ORAL | 0 refills | Status: AC
  Filled 2024-12-18: qty 90, 90d supply, fill #0

## 2024-12-18 MED ORDER — LISINOPRIL-HYDROCHLOROTHIAZIDE 20-25 MG PO TABS
1.0000 | ORAL_TABLET | Freq: Every day | ORAL | 0 refills | Status: DC
  Filled 2024-12-18: qty 90, 90d supply, fill #0

## 2024-12-18 MED ORDER — PAROXETINE HCL 20 MG PO TABS
20.0000 mg | ORAL_TABLET | Freq: Every day | ORAL | 0 refills | Status: AC
  Filled 2024-12-18: qty 90, 90d supply, fill #0

## 2024-12-20 ENCOUNTER — Other Ambulatory Visit (HOSPITAL_COMMUNITY): Payer: Self-pay

## 2024-12-20 MED ORDER — MOUNJARO 5 MG/0.5ML ~~LOC~~ SOAJ: 5.0000 mg | SUBCUTANEOUS | 1 refills | Status: AC

## 2024-12-20 MED ORDER — MOUNJARO 2.5 MG/0.5ML ~~LOC~~ SOAJ: 2.5000 mg | SUBCUTANEOUS | 0 refills | Status: AC

## 2024-12-21 ENCOUNTER — Telehealth: Payer: Self-pay

## 2024-12-21 ENCOUNTER — Encounter: Payer: Self-pay | Admitting: Pharmacist

## 2024-12-21 ENCOUNTER — Other Ambulatory Visit (HOSPITAL_COMMUNITY): Payer: Self-pay

## 2024-12-21 ENCOUNTER — Other Ambulatory Visit: Payer: Self-pay

## 2024-12-21 NOTE — Telephone Encounter (Signed)
 PAP: Patient assistance application for Trulicity through [employer] has been mailed to pt's home address on file. Provider portion of application will be faxed to provider's office.

## 2024-12-22 NOTE — Telephone Encounter (Signed)
 Received Provider portion PAP application [employer].

## 2024-12-25 ENCOUNTER — Telehealth: Payer: Self-pay

## 2024-12-25 NOTE — Progress Notes (Signed)
" ° °  12/25/2024  Patient ID: Nathaniel Montes, male   DOB: [birth year], 69 y.o.   MRN: [id number]  Returning missed call/voicemail from patient.  Patient has not yet received [employer] PAP application for [employer].  Advised him that application did not go out until the 1st of the week, so it will likely be a few more days before it arrives.  He will contact me if not received by the middle of next week.  Channing DELENA Mealing, PharmD, DPLA  "

## 2024-12-28 ENCOUNTER — Ambulatory Visit (HOSPITAL_BASED_OUTPATIENT_CLINIC_OR_DEPARTMENT_OTHER): Admission: RE | Admit: 2024-12-28 | Source: Ambulatory Visit

## 2024-12-28 ENCOUNTER — Other Ambulatory Visit: Admitting: Urology

## 2025-01-02 ENCOUNTER — Other Ambulatory Visit: Payer: Self-pay | Admitting: Family Medicine

## 2025-01-02 DIAGNOSIS — I10 Essential (primary) hypertension: Secondary | ICD-10-CM

## 2025-01-06 NOTE — Progress Notes (Unsigned)
 TRANSRECTAL ULTRASOUND AND PROSTATE BIOPSY  Indication:  Elevated PSA  Prophylactic antibiotic administration: {TRUS antibiotics:26399}  All medications that could result in increased bleeding were discontinued within an appropriate period of the time of biopsy.  Risk including bleeding and infection were discussed.  Informed consent was obtained.  The patient was placed in the left lateral decubitus position.  PROCEDURE 1.  TRANSRECTAL ULTRASOUND OF THE PROSTATE  The 7 MHz transrectal probe was used to image the prostate.  Anal stenosis {WAS/WAS [phone]::was not} noted.  TRUS volume: *** ml  Hypoechoic areas: {prostate anatomy:26400}  Hyperechoic areas: {prostate anatomy:26400}  Central calcifications: {DESC; PRESENT/NOT PRESENT:21021351}  Margins:  {Normal/Abnormal Appearance:21344::normal}  Seminal Vesicles: {Normal/Abnormal Appearance:21344::normal}   PROCEDURE 2:  PROSTATE BIOPSY  A periprostatic block was performed using 1% lidocaine and transrectal ultrasound guidance. Under transrectal ultrasound guidance, and using the Biopty gun, prostate biopsies were obtained systematically from the apex, mid gland, and base bilaterally.  A total of *** cores were obtained.  Hemostasis was obtained with gentle pressure on the prostate.  The procedures were well-tolerated.  No significant bleeding was noted at the end of the procedure.  The patient was stable for discharge from the office.

## 2025-01-14 ENCOUNTER — Other Ambulatory Visit

## 2025-01-18 ENCOUNTER — Other Ambulatory Visit: Admitting: Urology

## 2025-01-18 ENCOUNTER — Ambulatory Visit (HOSPITAL_BASED_OUTPATIENT_CLINIC_OR_DEPARTMENT_OTHER)

## 2025-01-18 DIAGNOSIS — R972 Elevated prostate specific antigen [PSA]: Secondary | ICD-10-CM

## 2025-02-03 ENCOUNTER — Ambulatory Visit: Admitting: Family Medicine

## 2025-11-16 ENCOUNTER — Ambulatory Visit
